# Patient Record
Sex: Male | Born: 1965 | Race: White | Hispanic: No | Marital: Married | Smoking: Current every day smoker
Health system: Southern US, Community
[De-identification: ages and names within clinical notes are randomized; demographics above are authoritative.]

## PROBLEM LIST (undated history)

## (undated) DIAGNOSIS — I10 Essential (primary) hypertension: Secondary | ICD-10-CM

## (undated) HISTORY — DX: Essential (primary) hypertension: I10

---

## 2000-02-13 ENCOUNTER — Other Ambulatory Visit: Admission: RE | Admit: 2000-02-13 | Discharge: 2000-02-13 | Payer: Self-pay | Admitting: Urology

## 2002-05-06 ENCOUNTER — Encounter: Admission: RE | Admit: 2002-05-06 | Discharge: 2002-06-10 | Payer: Self-pay | Admitting: Family Medicine

## 2016-10-04 ENCOUNTER — Ambulatory Visit (INDEPENDENT_AMBULATORY_CARE_PROVIDER_SITE_OTHER): Payer: Self-pay | Admitting: Physician Assistant

## 2016-10-04 VITALS — BP 156/92 | HR 78 | Temp 99.0°F | Resp 16 | Ht 70.0 in | Wt 217.0 lb

## 2016-10-04 DIAGNOSIS — I10 Essential (primary) hypertension: Secondary | ICD-10-CM | POA: Insufficient documentation

## 2016-10-04 DIAGNOSIS — Z13220 Encounter for screening for lipoid disorders: Secondary | ICD-10-CM

## 2016-10-04 DIAGNOSIS — R030 Elevated blood-pressure reading, without diagnosis of hypertension: Secondary | ICD-10-CM

## 2016-10-04 DIAGNOSIS — K219 Gastro-esophageal reflux disease without esophagitis: Secondary | ICD-10-CM

## 2016-10-04 MED ORDER — LISINOPRIL 10 MG PO TABS: 10.0000 mg | ORAL_TABLET | Freq: Every day | ORAL | 0 refills | Status: DC

## 2016-10-04 MED ORDER — OMEPRAZOLE 20 MG PO CPDR: 20.0000 mg | DELAYED_RELEASE_CAPSULE | Freq: Every day | ORAL | 0 refills | Status: DC

## 2016-10-04 NOTE — Progress Notes (Signed)
Jacob Chen  MRN: [id number] DOB: 05-Nov-1965  PCP: No primary care provider on file.  Subjective:  Pt is a 51 year old male presents to clinic for elevated blood pressure.  He is the youngest of 25 - all 38 of his brothers have HTN. This worries him and he would like to be worked up for high blood pressure.  C/o recent swelling in feet, sensitive bottoms of feet - he has to wear slippers in his house. Chest tightness - happens when he lays down and thinks about high blood pressure. Denies SOB, n/t, chest pain, SOB.  Home pressures have been around 160/100 - he has taken about 6 readings.   Exercises daily - rides bike and runs.  Diet - eats meat daily - mostly chicken. Eats fruits and veggies.   He is a smoker - 1/2 pack/day x 30 years.  Drinks alcohol - 8 drinks/week. Liquor He has cut back on drinking over the past month due to leg swelling. Was drinking 1/2 pint daily. No alcohol in 2 weeks. 1-2 beers/daily.   Abdominal pain - this is a chronic problem. 5/10 pain. Worse with laying down, sour taste in mouth, occasional burning in chest.    Review of Systems  Constitutional: Negative for diaphoresis and fatigue.  Respiratory: Negative for cough, chest tightness, shortness of breath and wheezing.   Cardiovascular: Positive for leg swelling. Negative for chest pain and palpitations.  Gastrointestinal: Positive for abdominal pain. Negative for diarrhea, nausea and vomiting.  Neurological: Negative for dizziness, syncope, light-headedness and headaches.    There are no active problems to display for this patient.   No current outpatient prescriptions on file prior to visit.   No current facility-administered medications on file prior to visit.     No Known Allergies   Objective:  BP (!) 142/80   Pulse 78   Temp 99 F (37.2 C) (Oral)   Resp 16   Ht 5' 10"  (1.778 m)   Wt 217 lb (98.4 kg)   SpO2 99%   BMI 31.14 kg/m   Physical Exam  Constitutional: He is oriented to  person, place, and time and well-developed, well-nourished, and in no distress. No distress.  Cardiovascular: Normal rate, regular rhythm, normal heart sounds, intact distal pulses and normal pulses.   Pulmonary/Chest: Effort normal. He has no wheezes. He has no rales.  Neurological: He is alert and oriented to person, place, and time. He has normal sensation. GCS score is 15.  Skin: Skin is warm and dry.  No edema b/l feet. Pulses in tact. No skin changes, ulcerations, temperature changes.   Psychiatric: Mood, memory, affect and judgment normal.  Vitals reviewed.   Assessment and Plan :  1. Elevated blood pressure reading 2. Essential hypertension - Recheck vitals - CMP14+EGFR - CBC with Differential/Platelet - lisinopril (PRINIVIL,ZESTRIL) 10 MG tablet; Take 1 tablet (10 mg total) by mouth daily.  Dispense: 45 tablet; Refill: 0 - Pt has strong FHx HTN. He exercises several times a week, room for improvement with diet.  DASH diet discussed and printed off for pt. Pt asked to record home blood pressures and bring them to next appt. Labs are pending, will contact with results. RTC in 4-6 weeks for f/u.   3. Screening, lipid - Lipid panel  4. Gastroesophageal reflux disease, esophagitis presence not specified - omeprazole (PRILOSEC) 20 MG capsule; Take 1 capsule (20 mg total) by mouth daily. Take 30 min before meal  Dispense: 60 capsule; Refill: 0 -  Abdominal pain likely due to GERD as per HPI. Will try trial of PPI, RTC in 4-6 weeks for recheck of symptoms. GERD prevention information discussed with pt.   Mercer Pod, PA-C  [facility] at [facility] 10/04/2016 9:33 AM

## 2016-10-04 NOTE — Patient Instructions (Addendum)
Come back and see me in 4-6 weeks for blood pressure check. Take a few blood pressures at home, write them down and bring them with you - do this at different times of the day.  See below for blood pressure lowering foods.   Omeprazole is for GERD. Take this 4-8 weeks, then stop therapy.   Thank you for coming in today. I hope you feel we met your needs.  Feel free to call [facility] if you have any questions or further requests.  Please consider signing up for MyChart if you do not already have it, as this is a great way to communicate with me.  Best,  Whitney McVey, PA-C  DASH Eating Plan DASH stands for "Dietary Approaches to Stop Hypertension." The DASH eating plan is a healthy eating plan that has been shown to reduce high blood pressure (hypertension). It may also reduce your risk for type 2 diabetes, heart disease, and stroke. The DASH eating plan may also help with weight loss. What are tips for following this plan? General guidelines   Avoid eating more than 2,300 mg (milligrams) of salt (sodium) a day. If you have hypertension, you may need to reduce your sodium intake to 1,500 mg a day.  Limit alcohol intake to no more than 1 drink a day for nonpregnant women and 2 drinks a day for men. One drink equals 12 oz of beer, 5 oz of wine, or 1 oz of hard liquor.  Work with your health care provider to maintain a healthy body weight or to lose weight. Ask what an ideal weight is for you.  Get at least 30 minutes of exercise that causes your heart to beat faster (aerobic exercise) most days of the week. Activities may include walking, swimming, or biking.  Work with your health care provider or diet and nutrition specialist (dietitian) to adjust your eating plan to your individual calorie needs. Reading food labels   Check food labels for the amount of sodium per serving. Choose foods with less than 5 percent of the Daily Value of sodium. Generally, foods with less than 300 mg of sodium per  serving fit into this eating plan.  To find whole grains, look for the word "whole" as the first word in the ingredient list. Shopping   Buy products labeled as "low-sodium" or "no salt added."  Buy fresh foods. Avoid canned foods and premade or frozen meals. Cooking   Avoid adding salt when cooking. Use salt-free seasonings or herbs instead of table salt or sea salt. Check with your health care provider or pharmacist before using salt substitutes.  Do not fry foods. Cook foods using healthy methods such as baking, boiling, grilling, and broiling instead.  Cook with heart-healthy oils, such as olive, canola, soybean, or sunflower oil. Meal planning    Eat a balanced diet that includes:  5 or more servings of fruits and vegetables each day. At each meal, try to fill half of your plate with fruits and vegetables.  Up to 6-8 servings of whole grains each day.  Less than 6 oz of lean meat, poultry, or fish each day. A 3-oz serving of meat is about the same size as a deck of cards. One egg equals 1 oz.  2 servings of low-fat dairy each day.  A serving of nuts, seeds, or beans 5 times each week.  Heart-healthy fats. Healthy fats called Omega-3 fatty acids are found in foods such as flaxseeds and coldwater fish, like sardines, salmon, and  mackerel.  Limit how much you eat of the following:  Canned or prepackaged foods.  Food that is high in trans fat, such as fried foods.  Food that is high in saturated fat, such as fatty meat.  Sweets, desserts, sugary drinks, and other foods with added sugar.  Full-fat dairy products.  Do not salt foods before eating.  Try to eat at least 2 vegetarian meals each week.  Eat more home-cooked food and less restaurant, buffet, and fast food.  When eating at a restaurant, ask that your food be prepared with less salt or no salt, if possible. What foods are recommended? The items listed may not be a complete list. Talk with your dietitian  about what dietary choices are best for you. Grains  Whole-grain or whole-wheat bread. Whole-grain or whole-wheat pasta. Brown rice. [place]row. Bulgur. Whole-grain and low-sodium cereals. Pita bread. Low-fat, low-sodium crackers. Whole-wheat flour tortillas. Vegetables  Fresh or frozen vegetables (raw, steamed, roasted, or grilled). Low-sodium or reduced-sodium tomato and vegetable juice. Low-sodium or reduced-sodium tomato sauce and tomato paste. Low-sodium or reduced-sodium canned vegetables. Fruits  All fresh, dried, or frozen fruit. Canned fruit in natural juice (without added sugar). Meat and other protein foods  Skinless chicken or [place]. Ground chicken or [place]. Pork with fat trimmed off. Fish and seafood. Egg whites. Dried beans, peas, or lentils. Unsalted nuts, nut butters, and seeds. Unsalted canned beans. Lean cuts of beef with fat trimmed off. Low-sodium, lean deli meat. Dairy  Low-fat (1%) or fat-free (skim) milk. Fat-free, low-fat, or reduced-fat cheeses. Nonfat, low-sodium ricotta or cottage cheese. Low-fat or nonfat yogurt. Low-fat, low-sodium cheese. Fats and oils  Soft margarine without trans fats. Vegetable oil. Low-fat, reduced-fat, or light mayonnaise and salad dressings (reduced-sodium). Canola, safflower, olive, soybean, and sunflower oils. Avocado. Seasoning and other foods  Herbs. Spices. Seasoning mixes without salt. Unsalted popcorn and pretzels. Fat-free sweets. What foods are not recommended? The items listed may not be a complete list. Talk with your dietitian about what dietary choices are best for you. Grains  Baked goods made with fat, such as croissants, muffins, or some breads. Dry pasta or rice meal packs. Vegetables  Creamed or fried vegetables. Vegetables in a cheese sauce. Regular canned vegetables (not low-sodium or reduced-sodium). Regular canned tomato sauce and paste (not low-sodium or reduced-sodium). Regular tomato and vegetable juice (not  low-sodium or reduced-sodium). Angie Fava. Olives. Fruits  Canned fruit in a light or heavy syrup. Fried fruit. Fruit in cream or butter sauce. Meat and other protein foods  Fatty cuts of meat. Ribs. Fried meat. Berniece Salines. Sausage. Bologna and other processed lunch meats. Salami. Fatback. Hotdogs. Bratwurst. Salted nuts and seeds. Canned beans with added salt. Canned or smoked fish. Whole eggs or egg yolks. Chicken or [place] with skin. Dairy  Whole or 2% milk, cream, and half-and-half. Whole or full-fat cream cheese. Whole-fat or sweetened yogurt. Full-fat cheese. Nondairy creamers. Whipped toppings. Processed cheese and cheese spreads. Fats and oils  Butter. Stick margarine. Lard. Shortening. Ghee. Bacon fat. Tropical oils, such as coconut, palm kernel, or palm oil. Seasoning and other foods  Salted popcorn and pretzels. Onion salt, garlic salt, seasoned salt, table salt, and sea salt. Worcestershire sauce. Tartar sauce. Barbecue sauce. Teriyaki sauce. Soy sauce, including reduced-sodium. Steak sauce. Canned and packaged gravies. Fish sauce. Oyster sauce. Cocktail sauce. Horseradish that you find on the shelf. Ketchup. Mustard. Meat flavorings and tenderizers. Bouillon cubes. Hot sauce and Tabasco sauce. Premade or packaged marinades. Premade or packaged taco  seasonings. Relishes. Regular salad dressings. Where to find more information:  National Heart, Lung, and [place]: https://wilson-eaton.com/  American Heart Association: www.heart.org Summary  The DASH eating plan is a healthy eating plan that has been shown to reduce high blood pressure (hypertension). It may also reduce your risk for type 2 diabetes, heart disease, and stroke.  With the DASH eating plan, you should limit salt (sodium) intake to 2,300 mg a day. If you have hypertension, you may need to reduce your sodium intake to 1,500 mg a day.  When on the DASH eating plan, aim to eat more fresh fruits and vegetables, whole grains, lean  proteins, low-fat dairy, and heart-healthy fats.  Work with your health care provider or diet and nutrition specialist (dietitian) to adjust your eating plan to your individual calorie needs. This information is not intended to replace advice given to you by your health care provider. Make sure you discuss any questions you have with your health care provider. Document Released: 06/22/2011 Document Revised: 06/26/2016 Document Reviewed: 06/26/2016 Elsevier Interactive Patient Education  2017 [employer].   IF you received an x-ray today, you will receive an invoice from [facility]. Please contact [facility] at [phone] with questions or concerns regarding your invoice.   IF you received labwork today, you will receive an invoice from [facility]. Please contact [facility] at [phone] with questions or concerns regarding your invoice.   Our billing staff will not be able to assist you with questions regarding bills from these companies.  You will be contacted with the lab results as soon as they are available. The fastest way to get your results is to activate your My Chart account. Instructions are located on the last page of this paperwork. If you have not heard from [place] regarding the results in 2 weeks, please contact this office.

## 2016-10-05 ENCOUNTER — Encounter: Payer: Self-pay | Admitting: Physician Assistant

## 2016-10-05 LAB — CBC WITH DIFFERENTIAL/PLATELET

## 2016-10-05 LAB — CMP14+EGFR
ALT: 79 IU/L — ABNORMAL HIGH (ref 0–44)
AST: 61 IU/L — ABNORMAL HIGH (ref 0–40)
Albumin/Globulin Ratio: 1.7 (ref 1.2–2.2)
Albumin: 4.5 g/dL (ref 3.5–5.5)
Alkaline Phosphatase: 85 IU/L (ref 39–117)
BUN/Creatinine Ratio: 14 (ref 9–20)
BUN: 11 mg/dL (ref 6–24)
Bilirubin Total: 0.5 mg/dL (ref 0.0–1.2)
CO2: 25 mmol/L (ref 18–29)
Calcium: 9.9 mg/dL (ref 8.7–10.2)
Chloride: 101 mmol/L (ref 96–106)
Creatinine, Ser: 0.76 mg/dL (ref 0.76–1.27)
GFR calc Af Amer: 122 mL/min/{1.73_m2} (ref >59)
GFR calc non Af Amer: 106 mL/min/{1.73_m2} (ref >59)
Globulin, Total: 2.7 g/dL (ref 1.5–4.5)
Glucose: 94 mg/dL (ref 65–99)
Potassium: 4.9 mmol/L (ref 3.5–5.2)
Sodium: 139 mmol/L (ref 134–144)
Total Protein: 7.2 g/dL (ref 6.0–8.5)

## 2016-10-05 LAB — LIPID PANEL
Chol/HDL Ratio: 3.3 ratio units (ref 0.0–5.0)
Cholesterol, Total: 187 mg/dL (ref 100–199)
HDL: 57 mg/dL (ref >39)
LDL Calculated: 107 mg/dL — ABNORMAL HIGH (ref 0–99)
Triglycerides: 117 mg/dL (ref 0–149)
VLDL Cholesterol Cal: 23 mg/dL (ref 5–40)

## 2016-10-10 ENCOUNTER — Encounter: Payer: Self-pay | Admitting: Physician Assistant

## 2016-10-16 NOTE — Progress Notes (Signed)
Elevated liver enzymes likely due to alcohol consumption. Advised pt to continue cutting back on the amount he drinks. Repeat labs in 6 months.

## 2016-11-16 ENCOUNTER — Telehealth: Payer: Self-pay | Admitting: Physician Assistant

## 2016-11-16 DIAGNOSIS — I10 Essential (primary) hypertension: Secondary | ICD-10-CM

## 2016-11-16 MED ORDER — LISINOPRIL 10 MG PO TABS: 10.0000 mg | ORAL_TABLET | Freq: Every day | ORAL | 0 refills | Status: DC

## 2016-11-16 NOTE — Telephone Encounter (Signed)
PATIENT WOULD LIKE Jacob Chen TO GIVE HIM A REFILL ON HIS LISINOPRIL 10 MG JUST ENOUGH TO LAST UNTIL HIS OFFICE VISIT WITH HER ON 11/27/16. HE ORIGINALLY HAD AN APPOINTMENT ON 11/20/16 BUT HE WILL BE OUT OF TOWN. BEST PHONE [phone] (CELL) PHARMACY CHOICE IS [facility]. [facility]BC

## 2016-11-20 ENCOUNTER — Ambulatory Visit: Payer: Self-pay | Admitting: Physician Assistant

## 2016-11-27 ENCOUNTER — Other Ambulatory Visit: Payer: Self-pay | Admitting: Physician Assistant

## 2016-11-27 ENCOUNTER — Ambulatory Visit (INDEPENDENT_AMBULATORY_CARE_PROVIDER_SITE_OTHER): Payer: Self-pay | Admitting: Physician Assistant

## 2016-11-27 ENCOUNTER — Encounter: Payer: Self-pay | Admitting: Physician Assistant

## 2016-11-27 VITALS — BP 149/92 | HR 77 | Resp 16 | Ht 69.69 in | Wt 212.0 lb

## 2016-11-27 DIAGNOSIS — K219 Gastro-esophageal reflux disease without esophagitis: Secondary | ICD-10-CM

## 2016-11-27 DIAGNOSIS — Z79899 Other long term (current) drug therapy: Secondary | ICD-10-CM

## 2016-11-27 DIAGNOSIS — I10 Essential (primary) hypertension: Secondary | ICD-10-CM

## 2016-11-27 MED ORDER — LISINOPRIL 20 MG PO TABS: 20.0000 mg | ORAL_TABLET | Freq: Every day | ORAL | 0 refills | Status: DC

## 2016-11-27 NOTE — Patient Instructions (Signed)
     IF you received an x-ray today, you will receive an invoice from [facility]. Please contact  Radiology at [phone] with questions or concerns regarding your invoice.   IF you received labwork today, you will receive an invoice from [facility]. Please contact [facility] at [phone] with questions or concerns regarding your invoice.   Our billing staff will not be able to assist you with questions regarding bills from these companies.  You will be contacted with the lab results as soon as they are available. The fastest way to get your results is to activate your My Chart account. Instructions are located on the last page of this paperwork. If you have not heard from us regarding the results in 2 weeks, please contact this office.     

## 2016-11-27 NOTE — Progress Notes (Signed)
   Jacob Chen  MRN: [id number] DOB: 09/08/1965  PCP: Jacob Chen, Jacob Lafuente Whitney, PA-C  Subjective:  Pt is a 51 year old male who presents to clinic for f/u HTN.  3/21 OV started Lisinopril 10 mg qd. Denies cough, swelling of the face, chest pain, neck pain, arm pain, headache, dizziness, lightheadedness, pre-syncope.  Today's blood pressure is 149/92. He recently took blood pressures at [facility] 154/95 and 148/96.  He has been cutting back on his carbohydrates.   Exercises daily - rides bike and runs.  Diet - eats meat daily - mostly chicken. Eats fruits and veggies  He is a smoker - 1/2 pack/day x 30 years.  Drinks alcohol - 8 drinks/week. Liquor He has cut back on drinking over the past month due to leg swelling. Was drinking 1/2 pint daily. No alcohol in 2 weeks. 1-2 beers/daily.  Review of Systems  Respiratory: Negative for cough, chest tightness, shortness of breath and wheezing.   Cardiovascular: Negative for chest pain, palpitations and leg swelling.  Neurological: Negative for dizziness, syncope, light-headedness and headaches.    Patient Active Problem List   Diagnosis Date Noted  . Essential hypertension 10/04/2016    Current Outpatient Prescriptions on File Prior to Visit  Medication Sig Dispense Refill  . lisinopril (PRINIVIL,ZESTRIL) 10 MG tablet Take 1 tablet (10 mg total) by mouth daily. 14 tablet 0  . omeprazole (PRILOSEC) 20 MG capsule Take 1 capsule (20 mg total) by mouth daily. Take 30 min before meal 60 capsule 0   No current facility-administered medications on file prior to visit.     No Known Allergies   Objective:  BP (!) 149/92 (BP Location: Right Arm, Patient Position: Sitting, Cuff Size: Normal)   Pulse 77   Resp 16   Ht 5' 9.69" (1.77 m)   Wt 212 lb (96.2 kg)   SpO2 100%   BMI 30.69 kg/m   Physical Exam  Constitutional: He is oriented to person, place, and time and well-developed, well-nourished, and in no distress. No distress.    Cardiovascular: Normal rate, regular rhythm and normal heart sounds.   Neurological: He is alert and oriented to person, place, and time. GCS score is 15.  Skin: Skin is warm and dry.  Psychiatric: Mood, memory, affect and judgment normal.  Vitals reviewed.   Assessment and Plan :  1. Essential hypertension 2. Encounter for medication management - lisinopril (PRINIVIL,ZESTRIL) 20 MG tablet; Take 1 tablet (20 mg total) by mouth daily.  Dispense: 90 tablet; Refill: 0 - This is Mr. Jacob Chen's first OV since starting him on Lisinopril. He is feeling well, denies side effects. Blood pressure remains elevated. He took 2 readings at home, also elevated. Will increased daily dose to 20mg . RTC in 1-2 months for recheck. Encouraged con't DASH diet and exercise.   Jacob CollieWhitney Madysen Faircloth, PA-C  [facility] 11/27/2016 8:13 AM

## 2016-11-30 MED ORDER — OMEPRAZOLE 20 MG PO CPDR: 20.0000 mg | DELAYED_RELEASE_CAPSULE | Freq: Every day | ORAL | 0 refills | Status: DC

## 2016-12-18 ENCOUNTER — Ambulatory Visit (INDEPENDENT_AMBULATORY_CARE_PROVIDER_SITE_OTHER): Payer: Self-pay | Admitting: Physician Assistant

## 2016-12-18 ENCOUNTER — Encounter: Payer: Self-pay | Admitting: Physician Assistant

## 2016-12-18 VITALS — BP 140/94 | HR 90 | Temp 98.6°F | Resp 16 | Ht 70.0 in | Wt 213.6 lb

## 2016-12-18 DIAGNOSIS — F17210 Nicotine dependence, cigarettes, uncomplicated: Secondary | ICD-10-CM

## 2016-12-18 DIAGNOSIS — I10 Essential (primary) hypertension: Secondary | ICD-10-CM

## 2016-12-18 DIAGNOSIS — R030 Elevated blood-pressure reading, without diagnosis of hypertension: Secondary | ICD-10-CM

## 2016-12-18 MED ORDER — LISINOPRIL 20 MG PO TABS: 20.0000 mg | ORAL_TABLET | Freq: Every day | ORAL | 0 refills | Status: DC

## 2016-12-18 NOTE — Progress Notes (Signed)
   Jacob Chen  MRN: [id number] DOB: 09/28/1965  PCP: Sebastian AcheMcVey, Berthel Bagnall Whitney, PA-C  Subjective:  Pt is a pleasant 51 year old male who presents to clinic for f/u blood pressure. Owns fitness Center, [employer]. He is an Biomedical engineerAir BNB host.   He was diagnosed with HTN 3 months ago. Since that time, he was started on Lisinopril and has cut back on his drinking. Denies medication side effects. Denies chest pain, palpitations, lightheadedness, headaches, vision changes, cough.   Last OV 5/14 blood pressure 149/92, with home blood pressures of 154/95 and 148/92. Increased lisinopril 20mg  qd from 10mg  qd.  Today his blood pressure is 150/97, recheck is 140/94. He reports home blood pressure Chen of 129/82.  Recent life stressor: brother diagnosed with Non-Hodgkin's Lymphoma last month.  Exercises daily - rides bike and runs.  Diet - eats meat daily - mostly chicken. Eats fruits and veggies  He is still smokin 1/2 pack/day x 30 years.  Drinks alcohol - 8 drinks/week. Liquor He has cut back on drinking over the past month due to leg swelling. Was drinking 1/2 pint daily. No alcohol in 2 weeks. 1-2 beers/daily.   Of note, he is about to start a CPAP soon.   Review of Systems  Constitutional: Negative for chills and diaphoresis.  Respiratory: Negative for cough, chest tightness, shortness of breath and wheezing.   Cardiovascular: Negative for chest pain, palpitations and leg swelling.  Gastrointestinal: Negative for diarrhea, nausea and vomiting.  Neurological: Negative for dizziness, syncope, light-headedness and headaches.    Patient Active Problem List   Diagnosis Date Noted  . Essential hypertension 10/04/2016    Current Outpatient Prescriptions on File Prior to Visit  Medication Sig Dispense Refill  . lisinopril (PRINIVIL,ZESTRIL) 20 MG tablet Take 1 tablet (20 mg total) by mouth daily. 90 tablet 0  . omeprazole (PRILOSEC) 20 MG capsule Take 1 capsule (20 mg total) by mouth daily.  Take 30 min before meal 60 capsule 0   No current facility-administered medications on file prior to visit.     No Known Allergies   Objective:  BP (!) 150/97   Pulse 90   Temp 98.6 F (37 C) (Oral)   Resp 16   Ht 5\' 10"  (1.778 m)   Wt 213 lb 9.6 oz (96.9 kg)   SpO2 97%   BMI 30.65 kg/m   Physical Exam  Constitutional: He is oriented to person, place, and time and well-developed, well-nourished, and in no distress. No distress.  Cardiovascular: Normal rate, regular rhythm and normal heart sounds.   Neurological: He is alert and oriented to person, place, and time. GCS score is 15.  Skin: Skin is warm and dry.  Psychiatric: Mood, memory, affect and judgment normal.  Vitals reviewed.   Assessment and Plan :  1. Essential hypertension 2. Elevated blood pressure Chen 3. Cigarette smoker - lisinopril (PRINIVIL,ZESTRIL) 20 MG tablet; Take 1 tablet (20 mg total) by mouth daily.  Dispense: 90 tablet; Refill: 0 - Recheck vitals - Blood pressure is elevated in office today. Suspect possible increased life stressor and recent illness. He reports blood pressure out of office wnl. Will con't Lisinopril 20mg  qd at this time. RTC in 2-3 months for annual exam and blood pressure recheck. Encouraged con't DASH diet and exercise.   Marco CollieWhitney Ever Gustafson, PA-C  [facility] 12/18/2016 8:10 AM

## 2016-12-18 NOTE — Patient Instructions (Addendum)
Please come back in 2-3 months for your annual physical. I would like to recheck your liver enzymes to make sure they have returned to normal. We will also recheck your blood pressure.  Take blood pressures at home a few time.  Try to cut back on your smoking.   Thank you for coming in today. I hope you feel we met your needs.  Feel free to call [facility] if you have any questions or further requests.  Please consider signing up for MyChart if you do not already have it, as this is a great way to communicate with me.  Best,  Whitney McVey, PA-C  DASH Eating Plan DASH stands for "Dietary Approaches to Stop Hypertension." The DASH eating plan is a healthy eating plan that has been shown to reduce high blood pressure (hypertension). It may also reduce your risk for type 2 diabetes, heart disease, and stroke. The DASH eating plan may also help with weight loss. What are tips for following this plan? General guidelines  Avoid eating more than 2,300 mg (milligrams) of salt (sodium) a day. If you have hypertension, you may need to reduce your sodium intake to 1,500 mg a day.  Limit alcohol intake to no more than 1 drink a day for nonpregnant women and 2 drinks a day for men. One drink equals 12 oz of beer, 5 oz of wine, or 1 oz of hard liquor.  Work with your health care provider to maintain a healthy body weight or to lose weight. Ask what an ideal weight is for you.  Get at least 30 minutes of exercise that causes your heart to beat faster (aerobic exercise) most days of the week. Activities may include walking, swimming, or biking.  Work with your health care provider or diet and nutrition specialist (dietitian) to adjust your eating plan to your individual calorie needs. Reading food labels  Check food labels for the amount of sodium per serving. Choose foods with less than 5 percent of the Daily Value of sodium. Generally, foods with less than 300 mg of sodium per serving fit into this eating  plan.  To find whole grains, look for the word "whole" as the first word in the ingredient list. Shopping  Buy products labeled as "low-sodium" or "no salt added."  Buy fresh foods. Avoid canned foods and premade or frozen meals. Cooking  Avoid adding salt when cooking. Use salt-free seasonings or herbs instead of table salt or sea salt. Check with your health care provider or pharmacist before using salt substitutes.  Do not fry foods. Cook foods using healthy methods such as baking, boiling, grilling, and broiling instead.  Cook with heart-healthy oils, such as olive, canola, soybean, or sunflower oil. Meal planning   Eat a balanced diet that includes: ? 5 or more servings of fruits and vegetables each day. At each meal, try to fill half of your plate with fruits and vegetables. ? Up to 6-8 servings of whole grains each day. ? Less than 6 oz of lean meat, poultry, or fish each day. A 3-oz serving of meat is about the same size as a deck of cards. One egg equals 1 oz. ? 2 servings of low-fat dairy each day. ? A serving of nuts, seeds, or beans 5 times each week. ? Heart-healthy fats. Healthy fats called Omega-3 fatty acids are found in foods such as flaxseeds and coldwater fish, like sardines, salmon, and mackerel.  Limit how much you eat of the following: ? Canned  or prepackaged foods. ? Food that is high in trans fat, such as fried foods. ? Food that is high in saturated fat, such as fatty meat. ? Sweets, desserts, sugary drinks, and other foods with added sugar. ? Full-fat dairy products.  Do not salt foods before eating.  Try to eat at least 2 vegetarian meals each week.  Eat more home-cooked food and less restaurant, buffet, and fast food.  When eating at a restaurant, ask that your food be prepared with less salt or no salt, if possible. What foods are recommended? The items listed may not be a complete list. Talk with your dietitian about what dietary choices are best  for you. Grains Whole-grain or whole-wheat bread. Whole-grain or whole-wheat pasta. Brown rice. [place] Morrow. Bulgur. Whole-grain and low-sodium cereals. Pita bread. Low-fat, low-sodium crackers. Whole-wheat flour tortillas. Vegetables Fresh or frozen vegetables (raw, steamed, roasted, or grilled). Low-sodium or reduced-sodium tomato and vegetable juice. Low-sodium or reduced-sodium tomato sauce and tomato paste. Low-sodium or reduced-sodium canned vegetables. Fruits All fresh, dried, or frozen fruit. Canned fruit in natural juice (without added sugar). Meat and other protein foods Skinless chicken or [place]. Ground chicken or [place]. Pork with fat trimmed off. Fish and seafood. Egg whites. Dried beans, peas, or lentils. Unsalted nuts, nut butters, and seeds. Unsalted canned beans. Lean cuts of beef with fat trimmed off. Low-sodium, lean deli meat. Dairy Low-fat (1%) or fat-free (skim) milk. Fat-free, low-fat, or reduced-fat cheeses. Nonfat, low-sodium ricotta or cottage cheese. Low-fat or nonfat yogurt. Low-fat, low-sodium cheese. Fats and oils Soft margarine without trans fats. Vegetable oil. Low-fat, reduced-fat, or light mayonnaise and salad dressings (reduced-sodium). Canola, safflower, olive, soybean, and sunflower oils. Avocado. Seasoning and other foods Herbs. Spices. Seasoning mixes without salt. Unsalted popcorn and pretzels. Fat-free sweets. What foods are not recommended? The items listed may not be a complete list. Talk with your dietitian about what dietary choices are best for you. Grains Baked goods made with fat, such as croissants, muffins, or some breads. Dry pasta or rice meal packs. Vegetables Creamed or fried vegetables. Vegetables in a cheese sauce. Regular canned vegetables (not low-sodium or reduced-sodium). Regular canned tomato sauce and paste (not low-sodium or reduced-sodium). Regular tomato and vegetable juice (not low-sodium or reduced-sodium). Angie Fava.  Olives. Fruits Canned fruit in a light or heavy syrup. Fried fruit. Fruit in cream or butter sauce. Meat and other protein foods Fatty cuts of meat. Ribs. Fried meat. Berniece Salines. Sausage. Bologna and other processed lunch meats. Salami. Fatback. Hotdogs. Bratwurst. Salted nuts and seeds. Canned beans with added salt. Canned or smoked fish. Whole eggs or egg yolks. Chicken or [place] with skin. Dairy Whole or 2% milk, cream, and half-and-half. Whole or full-fat cream cheese. Whole-fat or sweetened yogurt. Full-fat cheese. Nondairy creamers. Whipped toppings. Processed cheese and cheese spreads. Fats and oils Butter. Stick margarine. Lard. Shortening. Ghee. Bacon fat. Tropical oils, such as coconut, palm kernel, or palm oil. Seasoning and other foods Salted popcorn and pretzels. Onion salt, garlic salt, seasoned salt, table salt, and sea salt. Worcestershire sauce. Tartar sauce. Barbecue sauce. Teriyaki sauce. Soy sauce, including reduced-sodium. Steak sauce. Canned and packaged gravies. Fish sauce. Oyster sauce. Cocktail sauce. Horseradish that you find on the shelf. Ketchup. Mustard. Meat flavorings and tenderizers. Bouillon cubes. Hot sauce and Tabasco sauce. Premade or packaged marinades. Premade or packaged taco seasonings. Relishes. Regular salad dressings. Where to find more information:  National Heart, Lung, and [place]: https://wilson-eaton.com/  American Heart Association: www.heart.org Summary  The  DASH eating plan is a healthy eating plan that has been shown to reduce high blood pressure (hypertension). It may also reduce your risk for type 2 diabetes, heart disease, and stroke.  With the DASH eating plan, you should limit salt (sodium) intake to 2,300 mg a day. If you have hypertension, you may need to reduce your sodium intake to 1,500 mg a day.  When on the DASH eating plan, aim to eat more fresh fruits and vegetables, whole grains, lean proteins, low-fat dairy, and heart-healthy  fats.  Work with your health care provider or diet and nutrition specialist (dietitian) to adjust your eating plan to your individual calorie needs. This information is not intended to replace advice given to you by your health care provider. Make sure you discuss any questions you have with your health care provider. Document Released: 06/22/2011 Document Revised: 06/26/2016 Document Reviewed: 06/26/2016 Elsevier Interactive Patient Education  2017 [employer].  IF you received an x-ray today, you will receive an invoice from [facility]. Please contact [facility] at [phone] with questions or concerns regarding your invoice.   IF you received labwork today, you will receive an invoice from [facility]. Please contact [facility] at [phone] with questions or concerns regarding your invoice.   Our billing staff will not be able to assist you with questions regarding bills from these companies.  You will be contacted with the lab results as soon as they are available. The fastest way to get your results is to activate your My Chart account. Instructions are located on the last page of this paperwork. If you have not heard from [place] regarding the results in 2 weeks, please contact this office.

## 2017-01-08 ENCOUNTER — Ambulatory Visit: Payer: Self-pay | Admitting: Physician Assistant

## 2017-02-02 ENCOUNTER — Telehealth: Payer: Self-pay | Admitting: Physician Assistant

## 2017-02-02 DIAGNOSIS — K219 Gastro-esophageal reflux disease without esophagitis: Secondary | ICD-10-CM

## 2017-02-02 MED ORDER — OMEPRAZOLE 20 MG PO CPDR: 20.0000 mg | DELAYED_RELEASE_CAPSULE | Freq: Every day | ORAL | 0 refills | Status: DC

## 2017-02-02 NOTE — Telephone Encounter (Signed)
PATIENT IS REQUESTING [facility] TO REFILL HIS  OMEPRAZOLE (PRILOSEC) 20 MG. HE SAID HE IS COMPLETELY OUT. (I DID EXPLAIN OUR REFILL POLICY). BEST PHONE [phone] (CELL) PHARMACY CHOICE IS [facility]. MBC

## 2017-02-06 NOTE — Telephone Encounter (Signed)
Thank you! Pt needs OV, as he needs repeat liver enzymes.

## 2017-02-08 ENCOUNTER — Other Ambulatory Visit: Payer: Self-pay

## 2017-02-08 DIAGNOSIS — K219 Gastro-esophageal reflux disease without esophagitis: Secondary | ICD-10-CM

## 2017-02-08 MED ORDER — OMEPRAZOLE 20 MG PO CPDR
20.0000 mg | DELAYED_RELEASE_CAPSULE | Freq: Every day | ORAL | 0 refills | Status: DC
Start: 2017-02-08 — End: 2017-04-04

## 2017-02-08 NOTE — Telephone Encounter (Signed)
30 days given. Pt has apt 03/26/17 can receive more rx refills at that apt.

## 2017-02-26 ENCOUNTER — Other Ambulatory Visit: Payer: Self-pay | Admitting: Physician Assistant

## 2017-02-26 DIAGNOSIS — I10 Essential (primary) hypertension: Secondary | ICD-10-CM

## 2017-03-01 MED ORDER — LISINOPRIL 20 MG PO TABS: 20.0000 mg | ORAL_TABLET | Freq: Every day | ORAL | 0 refills | Status: DC

## 2017-03-26 ENCOUNTER — Ambulatory Visit: Payer: Self-pay | Admitting: Physician Assistant

## 2017-04-04 ENCOUNTER — Encounter: Payer: Self-pay | Admitting: Physician Assistant

## 2017-04-04 ENCOUNTER — Ambulatory Visit (INDEPENDENT_AMBULATORY_CARE_PROVIDER_SITE_OTHER): Payer: Self-pay | Admitting: Physician Assistant

## 2017-04-04 VITALS — BP 124/84 | HR 78 | Temp 98.3°F | Resp 16 | Ht 69.5 in | Wt 211.6 lb

## 2017-04-04 DIAGNOSIS — I10 Essential (primary) hypertension: Secondary | ICD-10-CM

## 2017-04-04 DIAGNOSIS — K219 Gastro-esophageal reflux disease without esophagitis: Secondary | ICD-10-CM

## 2017-04-04 MED ORDER — OMEPRAZOLE 20 MG PO CPDR: 20.0000 mg | DELAYED_RELEASE_CAPSULE | Freq: Every day | ORAL | 1 refills | Status: DC

## 2017-04-04 NOTE — Progress Notes (Signed)
Jacob Chen  MRN: [id number] DOB: 1966/07/15  PCP: Dorise Hiss, PA-C  Subjective:  Pt is a pleasant 51 year old male who presents to clinic for HTN f/u.  He has 2 daughters. Oldest 10 yo - school at [employer]. Youngest is 37, wants to be an NP.  Owns fitness Center, [employer]. He is an Development worker, international aid host.   He was diagnosed with HTN 6 months ago. Since that time, he was started on Lisinopril and has cut back on his drinking. Denies medication side effects. Denies chest pain, leg swelling, palpitations, lightheadedness, headaches, vision changes, cough.   He is taking lisinopril 70m qd. Does not miss doses. Last OV 3 months ago - blood pressure was 140/94.   Today his blood pressure is 124/84. He reports home blood pressure reading of 129/82.  Exercises daily - rides bike and runs.  Diet - eats meat daily - mostly chicken. Eats fruits and veggies. Recently his diet has not been great, as he has been out of his home, due to AWm. [employer]   He is still smoking 1/2 pack/day x 30 years. He is trying to cut back, kind of.  Drinks alcohol - 8 drinks/week. Mixed drinks.  Was drinking 1/2 pint daily.   He recently got a CPAP. Has not tried it yet.   Review of Systems  Constitutional: Negative for chills and diaphoresis.  Respiratory: Negative for cough, chest tightness, shortness of breath and wheezing.   Cardiovascular: Negative for chest pain, palpitations and leg swelling.  Gastrointestinal: Negative for diarrhea, nausea and vomiting.  Musculoskeletal: Negative for neck pain.  Neurological: Negative for dizziness, syncope, light-headedness and headaches.  Psychiatric/Behavioral: Negative for sleep disturbance. The patient is not nervous/anxious.     Patient Active Problem List   Diagnosis Date Noted  . Essential hypertension 10/04/2016    Current Outpatient Prescriptions on File Prior to Visit  Medication Sig Dispense Refill  . lisinopril  (PRINIVIL,ZESTRIL) 20 MG tablet Take 1 tablet (20 mg total) by mouth daily. 90 tablet 0  . omeprazole (PRILOSEC) 20 MG capsule Take 1 capsule (20 mg total) by mouth daily. Take 30 min before meal (Patient not taking: Reported on 04/04/2017) 30 capsule 0   No current facility-administered medications on file prior to visit.     No Known Allergies   Objective:  BP 124/84   Pulse 78   Temp 98.3 F (36.8 C) (Oral)   Resp 16   Ht 5' 9.5" (1.765 m)   Wt 211 lb 9.6 oz (96 kg)   SpO2 98%   BMI 30.80 kg/m   Physical Exam  Constitutional: He is oriented to person, place, and time and well-developed, well-nourished, and in no distress. No distress.  Cardiovascular: Normal rate, regular rhythm and normal heart sounds.   Neurological: He is alert and oriented to person, place, and time. GCS score is 15.  Skin: Skin is warm and dry.  Psychiatric: Mood, memory, affect and judgment normal.  Vitals reviewed.  Lab Results  Component Value Date   ALT 79 (H) 10/04/2016   AST 61 (H) 10/04/2016   ALKPHOS 85 10/04/2016   BILITOT 0.5 10/04/2016    Assessment and Plan :  1. Essential hypertension - CMP14+EGFR - Lipid panel - Blood pressure is now controlled. Con't Lisinopril [id number]. He does not need refills now. OK to refill when needed to make it through to his next appt in 6 months. Elevated LFT's at his OV 3 months  ago. Labs are pending. Will contact with results. Encouraged cutting back on smoking and drinking. Start CPAP.  RTC in 6 months for recheck.  2. Gastroesophageal reflux disease, esophagitis presence not specified - omeprazole (PRILOSEC) 20 MG capsule; Take 1 capsule (20 mg total) by mouth daily. Take 30 min before meal  Dispense: 90 capsule; Refill: 1  Whitney Bryella Diviney, PA-C  [facility] [place] 04/04/2017 8:14 AM

## 2017-04-04 NOTE — Patient Instructions (Addendum)
Come back and see me in 6 months.  Thank you for coming in today. I hope you feel we met your needs.  Feel free to call PCP if you have any questions or further requests.  Please consider signing up for MyChart if you do not already have it, as this is a great way to communicate with me.  Best,  Whitney McVey, PA-C    DASH Eating Plan DASH stands for "Dietary Approaches to Stop Hypertension." The DASH eating plan is a healthy eating plan that has been shown to reduce high blood pressure (hypertension). It may also reduce your risk for type 2 diabetes, heart disease, and stroke. The DASH eating plan may also help with weight loss. What are tips for following this plan? General guidelines  Avoid eating more than 2,300 mg (milligrams) of salt (sodium) a day. If you have hypertension, you may need to reduce your sodium intake to 1,500 mg a day.  Limit alcohol intake to no more than 1 drink a day for nonpregnant women and 2 drinks a day for men. One drink equals 12 oz of beer, 5 oz of wine, or 1 oz of hard liquor.  Work with your health care provider to maintain a healthy body weight or to lose weight. Ask what an ideal weight is for you.  Get at least 30 minutes of exercise that causes your heart to beat faster (aerobic exercise) most days of the week. Activities may include walking, swimming, or biking.  Work with your health care provider or diet and nutrition specialist (dietitian) to adjust your eating plan to your individual calorie needs. Reading food labels  Check food labels for the amount of sodium per serving. Choose foods with less than 5 percent of the Daily Value of sodium. Generally, foods with less than 300 mg of sodium per serving fit into this eating plan.  To find whole grains, look for the word "whole" as the first word in the ingredient list. Shopping  Buy products labeled as "low-sodium" or "no salt added."  Buy fresh foods. Avoid canned foods and premade or frozen  meals. Cooking  Avoid adding salt when cooking. Use salt-free seasonings or herbs instead of table salt or sea salt. Check with your health care provider or pharmacist before using salt substitutes.  Do not fry foods. Cook foods using healthy methods such as baking, boiling, grilling, and broiling instead.  Cook with heart-healthy oils, such as olive, canola, soybean, or sunflower oil. Meal planning   Eat a balanced diet that includes: ? 5 or more servings of fruits and vegetables each day. At each meal, try to fill half of your plate with fruits and vegetables. ? Up to 6-8 servings of whole grains each day. ? Less than 6 oz of lean meat, poultry, or fish each day. A 3-oz serving of meat is about the same size as a deck of cards. One egg equals 1 oz. ? 2 servings of low-fat dairy each day. ? A serving of nuts, seeds, or beans 5 times each week. ? Heart-healthy fats. Healthy fats called Omega-3 fatty acids are found in foods such as flaxseeds and coldwater fish, like sardines, salmon, and mackerel.  Limit how much you eat of the following: ? Canned or prepackaged foods. ? Food that is high in trans fat, such as fried foods. ? Food that is high in saturated fat, such as fatty meat. ? Sweets, desserts, sugary drinks, and other foods with added sugar. ? Full-fat dairy  products.  Do not salt foods before eating.  Try to eat at least 2 vegetarian meals each week.  Eat more home-cooked food and less restaurant, buffet, and fast food.  When eating at a restaurant, ask that your food be prepared with less salt or no salt, if possible. What foods are recommended? The items listed may not be a complete list. Talk with your dietitian about what dietary choices are best for you. Grains Whole-grain or whole-wheat bread. Whole-grain or whole-wheat pasta. Brown rice. [place]row. Bulgur. Whole-grain and low-sodium cereals. Pita bread. Low-fat, low-sodium crackers. Whole-wheat flour  tortillas. Vegetables Fresh or frozen vegetables (raw, steamed, roasted, or grilled). Low-sodium or reduced-sodium tomato and vegetable juice. Low-sodium or reduced-sodium tomato sauce and tomato paste. Low-sodium or reduced-sodium canned vegetables. Fruits All fresh, dried, or frozen fruit. Canned fruit in natural juice (without added sugar). Meat and other protein foods Skinless chicken or [place]. Ground chicken or [place]. Pork with fat trimmed off. Fish and seafood. Egg whites. Dried beans, peas, or lentils. Unsalted nuts, nut butters, and seeds. Unsalted canned beans. Lean cuts of beef with fat trimmed off. Low-sodium, lean deli meat. Dairy Low-fat (1%) or fat-free (skim) milk. Fat-free, low-fat, or reduced-fat cheeses. Nonfat, low-sodium ricotta or cottage cheese. Low-fat or nonfat yogurt. Low-fat, low-sodium cheese. Fats and oils Soft margarine without trans fats. Vegetable oil. Low-fat, reduced-fat, or light mayonnaise and salad dressings (reduced-sodium). Canola, safflower, olive, soybean, and sunflower oils. Avocado. Seasoning and other foods Herbs. Spices. Seasoning mixes without salt. Unsalted popcorn and pretzels. Fat-free sweets. What foods are not recommended? The items listed may not be a complete list. Talk with your dietitian about what dietary choices are best for you. Grains Baked goods made with fat, such as croissants, muffins, or some breads. Dry pasta or rice meal packs. Vegetables Creamed or fried vegetables. Vegetables in a cheese sauce. Regular canned vegetables (not low-sodium or reduced-sodium). Regular canned tomato sauce and paste (not low-sodium or reduced-sodium). Regular tomato and vegetable juice (not low-sodium or reduced-sodium). Angie Fava. Olives. Fruits Canned fruit in a light or heavy syrup. Fried fruit. Fruit in cream or butter sauce. Meat and other protein foods Fatty cuts of meat. Ribs. Fried meat. Berniece Salines. Sausage. Bologna and other processed lunch meats.  Salami. Fatback. Hotdogs. Bratwurst. Salted nuts and seeds. Canned beans with added salt. Canned or smoked fish. Whole eggs or egg yolks. Chicken or [place] with skin. Dairy Whole or 2% milk, cream, and half-and-half. Whole or full-fat cream cheese. Whole-fat or sweetened yogurt. Full-fat cheese. Nondairy creamers. Whipped toppings. Processed cheese and cheese spreads. Fats and oils Butter. Stick margarine. Lard. Shortening. Ghee. Bacon fat. Tropical oils, such as coconut, palm kernel, or palm oil. Seasoning and other foods Salted popcorn and pretzels. Onion salt, garlic salt, seasoned salt, table salt, and sea salt. Worcestershire sauce. Tartar sauce. Barbecue sauce. Teriyaki sauce. Soy sauce, including reduced-sodium. Steak sauce. Canned and packaged gravies. Fish sauce. Oyster sauce. Cocktail sauce. Horseradish that you find on the shelf. Ketchup. Mustard. Meat flavorings and tenderizers. Bouillon cubes. Hot sauce and Tabasco sauce. Premade or packaged marinades. Premade or packaged taco seasonings. Relishes. Regular salad dressings. Where to find more information:  National Heart, Lung, and [place]: https://wilson-eaton.com/  American Heart Association: www.heart.org Summary  The DASH eating plan is a healthy eating plan that has been shown to reduce high blood pressure (hypertension). It may also reduce your risk for type 2 diabetes, heart disease, and stroke.  With the DASH eating plan, you should limit  salt (sodium) intake to 2,300 mg a day. If you have hypertension, you may need to reduce your sodium intake to 1,500 mg a day.  When on the DASH eating plan, aim to eat more fresh fruits and vegetables, whole grains, lean proteins, low-fat dairy, and heart-healthy fats.  Work with your health care provider or diet and nutrition specialist (dietitian) to adjust your eating plan to your individual calorie needs. This information is not intended to replace advice given to you by your health  care provider. Make sure you discuss any questions you have with your health care provider. Document Released: 06/22/2011 Document Revised: 06/26/2016 Document Reviewed: 06/26/2016 Elsevier Interactive Patient Education  2017 [employer].   IF you received an x-ray today, you will receive an invoice from [facility]. Please contact [facility] at [phone] with questions or concerns regarding your invoice.   IF you received labwork today, you will receive an invoice from [facility]. Please contact [facility] at [phone] with questions or concerns regarding your invoice.   Our billing staff will not be able to assist you with questions regarding bills from these companies.  You will be contacted with the lab results as soon as they are available. The fastest way to get your results is to activate your My Chart account. Instructions are located on the last page of this paperwork. If you have not heard from [place] regarding the results in 2 weeks, please contact this office.

## 2017-04-05 LAB — CMP14+EGFR
ALT: 93 IU/L — ABNORMAL HIGH (ref 0–44)
AST: 59 IU/L — ABNORMAL HIGH (ref 0–40)
Albumin/Globulin Ratio: 1.7 (ref 1.2–2.2)
Albumin: 4.4 g/dL (ref 3.5–5.5)
Alkaline Phosphatase: 67 IU/L (ref 39–117)
BUN/Creatinine Ratio: 16 (ref 9–20)
BUN: 16 mg/dL (ref 6–24)
Bilirubin Total: 0.7 mg/dL (ref 0.0–1.2)
CO2: 21 mmol/L (ref 20–29)
Calcium: 9.6 mg/dL (ref 8.7–10.2)
Chloride: 101 mmol/L (ref 96–106)
Creatinine, Ser: 1 mg/dL (ref 0.76–1.27)
GFR calc Af Amer: 100 mL/min/{1.73_m2} (ref >59)
GFR calc non Af Amer: 86 mL/min/{1.73_m2} (ref >59)
Globulin, Total: 2.6 g/dL (ref 1.5–4.5)
Glucose: 95 mg/dL (ref 65–99)
Potassium: 4.6 mmol/L (ref 3.5–5.2)
Sodium: 139 mmol/L (ref 134–144)
Total Protein: 7 g/dL (ref 6.0–8.5)

## 2017-04-05 LAB — LIPID PANEL
Chol/HDL Ratio: 3.9 ratio (ref 0.0–5.0)
Cholesterol, Total: 204 mg/dL — ABNORMAL HIGH (ref 100–199)
HDL: 52 mg/dL (ref >39)
LDL Calculated: 117 mg/dL — ABNORMAL HIGH (ref 0–99)
Triglycerides: 177 mg/dL — ABNORMAL HIGH (ref 0–149)
VLDL Cholesterol Cal: 35 mg/dL (ref 5–40)

## 2017-04-25 ENCOUNTER — Ambulatory Visit (INDEPENDENT_AMBULATORY_CARE_PROVIDER_SITE_OTHER): Payer: Self-pay | Admitting: Physician Assistant

## 2017-04-25 ENCOUNTER — Ambulatory Visit (INDEPENDENT_AMBULATORY_CARE_PROVIDER_SITE_OTHER): Payer: Self-pay

## 2017-04-25 ENCOUNTER — Encounter: Payer: Self-pay | Admitting: Physician Assistant

## 2017-04-25 VITALS — BP 150/86 | HR 69 | Temp 98.5°F | Resp 16 | Ht 69.25 in | Wt 216.2 lb

## 2017-04-25 DIAGNOSIS — R05 Cough: Secondary | ICD-10-CM

## 2017-04-25 DIAGNOSIS — G479 Sleep disorder, unspecified: Secondary | ICD-10-CM

## 2017-04-25 DIAGNOSIS — R059 Cough, unspecified: Secondary | ICD-10-CM

## 2017-04-25 DIAGNOSIS — R030 Elevated blood-pressure reading, without diagnosis of hypertension: Secondary | ICD-10-CM

## 2017-04-25 DIAGNOSIS — R200 Anesthesia of skin: Secondary | ICD-10-CM

## 2017-04-25 DIAGNOSIS — R079 Chest pain, unspecified: Secondary | ICD-10-CM

## 2017-04-25 DIAGNOSIS — Z87898 Personal history of other specified conditions: Secondary | ICD-10-CM

## 2017-04-25 DIAGNOSIS — R202 Paresthesia of skin: Secondary | ICD-10-CM

## 2017-04-25 MED ORDER — ALPRAZOLAM 0.5 MG PO TABS: 0.5000 mg | ORAL_TABLET | Freq: Every evening | ORAL | 0 refills | Status: DC | PRN

## 2017-04-25 NOTE — Patient Instructions (Addendum)
Stop your Prilosec for the next 3 weeks. Come back and we will test you for H Pylori.  Patients should be advised to stop PPI therapy one to two weeks prior to testing. If feasible, testing should performed at least four weeks after completion of bismuth/antibiotic treatment  Your EKG and chest x-ray look great.   Thank you for coming in today. I hope you feel we met your needs. Feel free to call PCP if you have any questions or further requests. Please consider signing up for MyChart if you do not already have it, as this is a great way to communicate with me.  Best,  Whitney McVey, PA-C  Helicobacter Pylori Infection Helicobacter pylori infection is an infection in the stomach that is caused by the Helicobacter pylori (H. pylori) bacteria. This type of bacteria often lives in the lining of the stomach. The infection can cause ulcers and irritation (gastritis) in some people. It is the most common cause of ulcers in the stomach (gastric ulcer) and in the upper part of the intestine (duodenal ulcer). Having this infection may also increase the risk of stomach cancer and a type of white blood cell cancer (lymphoma) that affects the stomach. What are the causes? H. pylori is a type of bacteria that is often found in the stomachs of healthy people. The bacteria may be passed from person to person through contact with stool or saliva. It is not known why some people develop ulcers, gastritis, or cancer from the infection. What increases the risk? This condition is more likely to develop in people who:  Have family members with the infection.  Live with many other people, such as in a dormitory.  Are of African, Hispanic, or Asian descent.  What are the signs or symptoms? Most people with this infection do not have symptoms. If you do have symptoms, they may include:  Heartburn.  Stomach pain.  Nausea.  Vomiting.  Blood-tinged vomit.  Loss of appetite.  Bad breath.  How is this  diagnosed? This condition may be diagnosed based on your symptoms, a physical exam, and various tests. Tests may include:  Blood tests or stool tests to check for the proteins (antibodies) that your body may produce in response to the bacteria. These tests are the best way to confirm the diagnosis.  A breath test to check for the type of gas that the H. pylori bacteria release after breaking down a substance called urea. For the test, you are asked to drink urea. This test is often done after treatment in order to find out if the treatment worked.  A procedure in which a thin, flexible tube with a tiny camera at the end is placed into your stomach and upper intestine (upper endoscopy). Your health care provider may also take tissue samples (biopsy) to test for H. pylori and cancer.  How is this treated? Treatment for this condition usually involves taking a combination of medicines (triple therapy) for a couple of weeks. Triple therapy includes one medicine to reduce the acid in your stomach and two types of antibiotic medicines. Many drug combinations have been approved for treatment. Treatment usually kills the H. pylori and reduces your risk of cancer. You may need to be tested for H. pylori again after treatment. In some cases, the treatment may need to be repeated. Follow these instructions at home:  Take over-the-counter and prescription medicines only as told by your health care provider.  Take your antibiotics as told by your health  care provider. Do not stop taking the antibiotics even if you start to feel better.  You can do all your usual activities and eat what you usually do.  Take steps to prevent future infections: ? Wash your hands often. ? Make sure the food you eat has been properly prepared. ? Drink water only from clean sources.  Keep all follow-up visits as told by your health care provider. This is important. Contact a health care provider if:  Your symptoms do not  get better.  Your symptoms return after treatment. This information is not intended to replace advice given to you by your health care provider. Make sure you discuss any questions you have with your health care provider. Document Released: 10/25/2015 Document Revised: 12/09/2015 Document Reviewed: 07/15/2014 Elsevier Interactive Patient Education  2018 [employer].    IF you received an x-ray today, you will receive an invoice from [facility]. Please contact [facility] at [phone] with questions or concerns regarding your invoice.   IF you received labwork today, you will receive an invoice from [facility]. Please contact [facility] at [phone] with questions or concerns regarding your invoice.   Our billing staff will not be able to assist you with questions regarding bills from these companies.  You will be contacted with the lab results as soon as they are available. The fastest way to get your results is to activate your My Chart account. Instructions are located on the last page of this paperwork. If you have not heard from [place] regarding the results in 2 weeks, please contact this office.

## 2017-04-25 NOTE — Progress Notes (Signed)
Jacob Chen  MRN: [id number] DOB: 1966-05-10  PCP: Dorise Hiss, PA-C  Subjective:  Pt is a 51 year old male PMH HTN, smoking and chronic alcohol use who presents to clinic for chest pain.  "feels like a pull muscle" sharp pain, located upper-to- mid left side of chest x 1 wk. Constant, does not get better or worse. Endorses decreased appetite recently.  "feels hot". Endorses some cough, more persistent recently. Worse when laying down. Not bad when standing/sitting.   Endorses some increased daily anxiety - "maybe worse" recently.  Not sleeping well.  He is taking Prilosec and lisinopril daily.  Smoker 1/2 ppd x >20 years.   Denies back pain, fever, chills, shob, diaphoresis, neck pain, arm pain, n/v.   Review of Systems  Constitutional: Positive for appetite change. Negative for chills, diaphoresis, fatigue and fever.  Respiratory: Positive for cough.   Cardiovascular: Positive for chest pain.  Skin: Negative.   Psychiatric/Behavioral: Positive for sleep disturbance.    Patient Active Problem List   Diagnosis Date Noted  . Essential hypertension 10/04/2016    Current Outpatient Prescriptions on File Prior to Visit  Medication Sig Dispense Refill  . lisinopril (PRINIVIL,ZESTRIL) 20 MG tablet Take 1 tablet (20 mg total) by mouth daily. 90 tablet 0  . omeprazole (PRILOSEC) 20 MG capsule Take 1 capsule (20 mg total) by mouth daily. Take 30 min before meal 90 capsule 1   No current facility-administered medications on file prior to visit.     No Known Allergies   Objective:  BP (!) 150/86   Pulse 69   Temp 98.5 F (36.9 C) (Oral)   Resp 16   Ht 5' 9.25" (1.759 m)   Wt 216 lb 3.2 oz (98.1 kg)   SpO2 99%   BMI 31.70 kg/m   Physical Exam  Constitutional: He is oriented to person, place, and time and well-developed, well-nourished, and in no distress. No distress.  Cardiovascular: Normal rate, regular rhythm and normal heart sounds.   Pulmonary/Chest:  Effort normal. He has rhonchi in the right middle field and the right lower field.  Neurological: He is alert and oriented to person, place, and time. GCS score is 15.  Skin: Skin is warm and dry.  Psychiatric: Mood, memory, affect and judgment normal.  Vitals reviewed.  Dg Chest 2 View  Result Date: 04/25/2017 CLINICAL DATA:  Cough EXAM: CHEST  2 VIEW COMPARISON:  None. FINDINGS: Heart and mediastinal contours are within normal limits. No focal opacities or effusions. No acute bony abnormality. IMPRESSION: No active cardiopulmonary disease. Electronically Signed   By: Rolm Baptise M.D.   On: 04/25/2017 12:32    EKG - no S-T elevation or flipped t waves. Normal sinus rhythm. No hypertrophy.  Assessment and Plan :  1. Chest pain, unspecified type - EKG 12-Lead - DG Chest 2 View; Future - Negative chest x-ray, EKG is reassuring. Suspect possible H Pylori infection vs increased anxiety. Plan to d/c Prilosec x 3 weeks. RTC for H Pylori testing. Will Rx Xanax for sleep, which is more than likely contributing to his symptoms. Labs are pending. Will contact with results.  2. Numbness and tingling - Vitamin B12 - CBC with Differential/Platelet  3. Cough - DG Chest 2 View; Future  4. History of alcohol use - Vitamin B1 - CMP14+EGFR - Gamma GT  5. Elevated blood pressure reading - Recheck vitals  6. Difficulty sleeping - ALPRAZolam (XANAX) 0.5 MG tablet; Take 1 tablet (0.5 mg total)  by mouth at bedtime as needed for anxiety.  Dispense: 60 tablet; Refill: 0   Mercer Pod, PA-C  [facility] at [facility] 04/25/2017 11:54 AM

## 2017-04-27 ENCOUNTER — Encounter: Payer: Self-pay | Admitting: Physician Assistant

## 2017-04-27 LAB — CBC WITH DIFFERENTIAL/PLATELET
Basophils Absolute: 0 10*3/uL (ref 0.0–0.2)
Basos: 1 %
EOS (ABSOLUTE): 0.1 10*3/uL (ref 0.0–0.4)
Eos: 1 %
Hematocrit: 46.4 % (ref 37.5–51.0)
Hemoglobin: 15.9 g/dL (ref 13.0–17.7)
Immature Grans (Abs): 0 10*3/uL (ref 0.0–0.1)
Immature Granulocytes: 0 %
Lymphocytes Absolute: 2 10*3/uL (ref 0.7–3.1)
Lymphs: 30 %
MCH: 32.1 pg (ref 26.6–33.0)
MCHC: 34.3 g/dL (ref 31.5–35.7)
MCV: 94 fL (ref 79–97)
Monocytes Absolute: 0.3 10*3/uL (ref 0.1–0.9)
Monocytes: 5 %
Neutrophils Absolute: 4.1 10*3/uL (ref 1.4–7.0)
Neutrophils: 63 %
Platelets: 107 10*3/uL — ABNORMAL LOW (ref 150–379)
RBC: 4.96 x10E6/uL (ref 4.14–5.80)
RDW: 13.5 % (ref 12.3–15.4)
WBC: 6.5 10*3/uL (ref 3.4–10.8)

## 2017-04-27 LAB — CMP14+EGFR
ALT: 75 IU/L — ABNORMAL HIGH (ref 0–44)
AST: 45 IU/L — ABNORMAL HIGH (ref 0–40)
Albumin/Globulin Ratio: 1.8 (ref 1.2–2.2)
Albumin: 4.5 g/dL (ref 3.5–5.5)
Alkaline Phosphatase: 60 IU/L (ref 39–117)
BUN/Creatinine Ratio: 18 (ref 9–20)
BUN: 13 mg/dL (ref 6–24)
Bilirubin Total: 0.8 mg/dL (ref 0.0–1.2)
CO2: 24 mmol/L (ref 20–29)
Calcium: 9.5 mg/dL (ref 8.7–10.2)
Chloride: 102 mmol/L (ref 96–106)
Creatinine, Ser: 0.72 mg/dL — ABNORMAL LOW (ref 0.76–1.27)
GFR calc Af Amer: 124 mL/min/{1.73_m2} (ref >59)
GFR calc non Af Amer: 107 mL/min/{1.73_m2} (ref >59)
Globulin, Total: 2.5 g/dL (ref 1.5–4.5)
Glucose: 104 mg/dL — ABNORMAL HIGH (ref 65–99)
Potassium: 4.5 mmol/L (ref 3.5–5.2)
Sodium: 140 mmol/L (ref 134–144)
Total Protein: 7 g/dL (ref 6.0–8.5)

## 2017-04-27 LAB — GAMMA GT: GGT: 135 IU/L — ABNORMAL HIGH (ref 0–65)

## 2017-04-27 LAB — VITAMIN B1

## 2017-04-27 LAB — VITAMIN B12: Vitamin B-12: >2000 pg/mL — ABNORMAL HIGH (ref 232–1245)

## 2017-04-30 ENCOUNTER — Other Ambulatory Visit: Payer: Self-pay | Admitting: Physician Assistant

## 2017-04-30 ENCOUNTER — Encounter: Payer: Self-pay | Admitting: Physician Assistant

## 2017-04-30 DIAGNOSIS — R945 Abnormal results of liver function studies: Principal | ICD-10-CM

## 2017-04-30 DIAGNOSIS — R7989 Other specified abnormal findings of blood chemistry: Secondary | ICD-10-CM

## 2017-04-30 NOTE — Progress Notes (Signed)
Elevated LFT's and GGT. Will check hepatitis panel. OK to come in for LAB ONLY visit.

## 2017-04-30 NOTE — Addendum Note (Signed)
Addended by: Baldwin Crown D on: 04/30/2017 04:07 PM   Modules accepted: Orders

## 2017-05-16 ENCOUNTER — Ambulatory Visit (INDEPENDENT_AMBULATORY_CARE_PROVIDER_SITE_OTHER): Payer: Self-pay | Admitting: Physician Assistant

## 2017-05-16 ENCOUNTER — Encounter: Payer: Self-pay | Admitting: Physician Assistant

## 2017-05-16 VITALS — BP 138/92 | HR 81 | Temp 98.1°F | Resp 16 | Ht 69.25 in | Wt 217.6 lb

## 2017-05-16 DIAGNOSIS — R1013 Epigastric pain: Secondary | ICD-10-CM

## 2017-05-16 DIAGNOSIS — R7989 Other specified abnormal findings of blood chemistry: Secondary | ICD-10-CM

## 2017-05-16 DIAGNOSIS — R945 Abnormal results of liver function studies: Secondary | ICD-10-CM

## 2017-05-16 DIAGNOSIS — R202 Paresthesia of skin: Secondary | ICD-10-CM

## 2017-05-16 DIAGNOSIS — R5383 Other fatigue: Secondary | ICD-10-CM

## 2017-05-16 DIAGNOSIS — R200 Anesthesia of skin: Secondary | ICD-10-CM

## 2017-05-16 NOTE — Progress Notes (Signed)
   Jacob Chen  MRN: [id number] DOB: 05/15/1966  PCP: Sebastian AcheMcVey, Deah Ottaway Whitney, PA-C  Subjective:  Pt is a 51 year old male who presents to clinic for f/u abdominal pain.  He was here 2 weeks ago for chest pain - negative work-up.  Advised pt to d/c his PPI and RTC for H Pylori testing.  He has been paying close attention to the foods he eats which causes him abdominal pain and is curbing his intake. His has cut back on his drinking, this is helping a lot.    Xanax is helping anxiety.   Fatigue. C/o fatigue, however this has improved recently.   Review of Systems  Constitutional: Negative for chills and diaphoresis.  Gastrointestinal: Positive for abdominal pain. Negative for constipation, diarrhea, nausea and vomiting.  Psychiatric/Behavioral: The patient is nervous/anxious.     Patient Active Problem List   Diagnosis Date Noted  . Essential hypertension 10/04/2016    Current Outpatient Prescriptions on File Prior to Visit  Medication Sig Dispense Refill  . ALPRAZolam (XANAX) 0.5 MG tablet Take 1 tablet (0.5 mg total) by mouth at bedtime as needed for anxiety. 60 tablet 0  . lisinopril (PRINIVIL,ZESTRIL) 20 MG tablet Take 1 tablet (20 mg total) by mouth daily. 90 tablet 0  . omeprazole (PRILOSEC) 20 MG capsule Take 1 capsule (20 mg total) by mouth daily. Take 30 min before meal (Patient not taking: Reported on 05/16/2017) 90 capsule 1   No current facility-administered medications on file prior to visit.     No Known Allergies   Objective:  BP (!) 138/92   Pulse 81   Temp 98.1 F (36.7 C) (Oral)   Resp 16   Ht 5' 9.25" (1.759 m)   Wt 217 lb 9.6 oz (98.7 kg)   SpO2 100%   BMI 31.90 kg/m   Physical Exam  Constitutional: He is oriented to person, place, and time and well-developed, well-nourished, and in no distress. No distress.  Cardiovascular: Normal rate, regular rhythm and normal heart sounds.   Neurological: He is alert and oriented to person, place, and  time. GCS score is 15.  Skin: Skin is warm and dry.  Psychiatric: Mood, memory, affect and judgment normal.  Vitals reviewed.   Assessment and Plan :  1. Epigastric pain - H. pylori breath test - He is here today for f/u abdominal pain, H Pylori testing after d/cing PPI after last OV. He plans to con't stay off his PPI and treat abdominal pain with improved diet. OK to refill if he calls in.  2. Fatigue, unspecified type - Unsure if this is due to anxiety/depression. He plans to speak with a therapist.   3. Elevated LFTs - HepB+HepC+HIV Panel - Last OV lab results showed elevated GGT and low platelets. Plan to check liver panel. Consider ultrasound.  4. Numbness and tingling - Vitamin B1 - Lab was spun down and sent incorrectly to [facility] last OV. This was corrected in paperwork, will redraw.   Marco CollieWhitney Isaly Fasching, PA-C  Primary [facility] at [facility] 05/16/2017 8:19 AM

## 2017-05-16 NOTE — Patient Instructions (Signed)
     IF you received an x-ray today, you will receive an invoice from [facility]. Please contact [facility] at [phone] with questions or concerns regarding your invoice.   IF you received labwork today, you will receive an invoice from [facility]. Please contact [facility] at [phone] with questions or concerns regarding your invoice.   Our billing staff will not be able to assist you with questions regarding bills from these companies.  You will be contacted with the lab results as soon as they are available. The fastest way to get your results is to activate your My Chart account. Instructions are located on the last page of this paperwork. If you have not heard from us regarding the results in 2 weeks, please contact this office.     

## 2017-05-17 LAB — H. PYLORI BREATH TEST: H pylori Breath Test: NEGATIVE

## 2017-05-19 LAB — HEPB+HEPC+HIV PANEL
HIV Screen 4th Generation wRfx: NONREACTIVE
Hep B C IgM: NEGATIVE
Hep B Core Total Ab: NEGATIVE
Hep B E Ab: NEGATIVE
Hep B E Ag: NEGATIVE
Hep B Surface Ab, Qual: NONREACTIVE
Hep C Virus Ab: <0.1 {s_co_ratio} (ref 0.0–0.9)
Hepatitis B Surface Ag: NEGATIVE

## 2017-05-19 LAB — VITAMIN B1: Thiamine: 118.9 nmol/L (ref 66.5–200.0)

## 2017-05-28 ENCOUNTER — Encounter: Payer: Self-pay | Admitting: Physician Assistant

## 2017-06-05 NOTE — Telephone Encounter (Signed)
Hey there Kristi!  Can you please help me with this? I am hoping there is something I can do about this pt's [facility] bill. I was following up on +abdominal pain and elevated liver enzymes with a hepatitis panel and had no idea how much these labs cost. This pt does not have insurance and is now stuck with a $780 bill.  Dr. Clelia CroftShaw mentioned we may be able to help him.  I am so sorry I didn't realize these labs were so expensive. Thank you for helping me with this.  Best,  CitigroupWhitney Keyosha Tiedt, PA-C

## 2017-08-22 ENCOUNTER — Other Ambulatory Visit: Payer: Self-pay | Admitting: Physician Assistant

## 2017-08-22 DIAGNOSIS — G479 Sleep disorder, unspecified: Secondary | ICD-10-CM

## 2017-08-22 DIAGNOSIS — I10 Essential (primary) hypertension: Secondary | ICD-10-CM

## 2017-08-23 ENCOUNTER — Other Ambulatory Visit: Payer: Self-pay | Admitting: Physician Assistant

## 2017-08-23 DIAGNOSIS — G479 Sleep disorder, unspecified: Secondary | ICD-10-CM

## 2017-08-23 MED ORDER — LISINOPRIL 20 MG PO TABS: 20.0000 mg | ORAL_TABLET | Freq: Every day | ORAL | 0 refills | Status: DC

## 2017-08-28 ENCOUNTER — Telehealth: Payer: Self-pay

## 2017-08-28 MED ORDER — ALPRAZOLAM 0.5 MG PO TABS: 0.5000 mg | ORAL_TABLET | Freq: Every evening | ORAL | 0 refills | Status: DC | PRN

## 2017-08-28 NOTE — Telephone Encounter (Signed)
Rx for alprazolam 0.5 mg #60 with no refills- faxed to [facility] at [phone]. Confirmation fax received at 10:28 am.  Lm on vm requested rx  faxed over to pharmacy and if he has question to feel free to call office. Dgaddy, CMA

## 2017-08-31 NOTE — Telephone Encounter (Signed)
Refill request responded to by other means, see 08/28/17 telephone encounter. Closing note.

## 2017-09-20 ENCOUNTER — Encounter: Payer: Self-pay | Admitting: Physician Assistant

## 2017-09-25 ENCOUNTER — Other Ambulatory Visit: Payer: Self-pay | Admitting: Physician Assistant

## 2017-09-25 DIAGNOSIS — I10 Essential (primary) hypertension: Secondary | ICD-10-CM

## 2017-09-25 MED ORDER — LISINOPRIL 20 MG PO TABS: 20.0000 mg | ORAL_TABLET | Freq: Every day | ORAL | 0 refills | Status: DC

## 2017-09-25 NOTE — Telephone Encounter (Signed)
Pt. is requesting a 90 day supply of Lisinopril 20 mg.    Last office visit: 05/16/18 PCP : Marco CollieWhitney McVey [facility] [place]  Scheduled for 6 mo. F/u 10/03/17

## 2017-09-25 NOTE — Telephone Encounter (Signed)
Copied from CRM [id number]. Topic: Quick Communication - Rx Refill/Question >> Sep 25, 2017 12:06 PM Caryn SectionMorris, Dawnette Mione E, NT wrote: Medication: lisinopril (PRINIVIL,ZESTRIL) 20 MG tablet. Patient is requesting a 90 or 180 day supply. Pt said it cost him the same as a 30 day supply.   Has the patient contacted their pharmacy? Yes    (Agent: If no, request that the patient contact the pharmacy for the refill.)  [facility] [id number] - [place], [place] - [address] [phone] (Phone) [phone] (Fax)   Preferred Pharmacy (with phone number or street name):    Agent: Please be advised that RX refills may take up to 3 business days. We ask that you follow-up with your pharmacy.

## 2017-10-03 ENCOUNTER — Encounter: Payer: Self-pay | Admitting: Physician Assistant

## 2017-10-03 ENCOUNTER — Other Ambulatory Visit: Payer: Self-pay

## 2017-10-03 ENCOUNTER — Ambulatory Visit (INDEPENDENT_AMBULATORY_CARE_PROVIDER_SITE_OTHER): Payer: Self-pay | Admitting: Physician Assistant

## 2017-10-03 DIAGNOSIS — K219 Gastro-esophageal reflux disease without esophagitis: Secondary | ICD-10-CM

## 2017-10-03 DIAGNOSIS — I10 Essential (primary) hypertension: Secondary | ICD-10-CM

## 2017-10-03 DIAGNOSIS — G479 Sleep disorder, unspecified: Secondary | ICD-10-CM

## 2017-10-03 MED ORDER — ALPRAZOLAM 0.5 MG PO TABS: 0.5000 mg | ORAL_TABLET | Freq: Every evening | ORAL | 0 refills | Status: DC | PRN

## 2017-10-03 MED ORDER — LISINOPRIL 20 MG PO TABS: 20.0000 mg | ORAL_TABLET | Freq: Every day | ORAL | 3 refills | Status: DC

## 2017-10-03 NOTE — Patient Instructions (Addendum)
Come back in September for your annual exam.     Fat and Cholesterol Restricted Diet Getting too much fat and cholesterol in your diet may cause health problems. Following this diet helps keep your fat and cholesterol at normal levels. This can keep you from getting sick. What types of fat should I choose?  Choose monosaturated and polyunsaturated fats. These are found in foods such as olive oil, canola oil, flaxseeds, walnuts, almonds, and seeds.  Eat more omega-3 fats. Good choices include salmon, mackerel, sardines, tuna, flaxseed oil, and ground flaxseeds.  Limit saturated fats. These are in animal products such as meats, butter, and cream. They can also be in plant products such as palm oil, palm kernel oil, and coconut oil.  Avoid foods with partially hydrogenated oils in them. These contain trans fats. Examples of foods that have trans fats are stick margarine, some tub margarines, cookies, crackers, and other baked goods. What general guidelines do I need to follow?  Check food labels. Look for the words "trans fat" and "saturated fat."  When preparing a meal: ? Fill half of your plate with vegetables and green salads. ? Fill one fourth of your plate with whole grains. Look for the word "whole" as the first word in the ingredient list. ? Fill one fourth of your plate with lean protein foods.  Eat more foods that have fiber, like apples, carrots, beans, peas, and barley.  Eat more home-cooked foods. Eat less at restaurants and buffets.  Limit or avoid alcohol.  Limit foods high in starch and sugar.  Limit fried foods.  Cook foods without frying them. Baking, boiling, grilling, and broiling are all great options.  Lose weight if you are overweight. Losing even a small amount of weight can help your overall health. It can also help prevent diseases such as diabetes and heart disease. What foods can I eat? Grains Whole grains, such as whole wheat or whole grain breads,  crackers, cereals, and pasta. Unsweetened oatmeal, bulgur, barley, quinoa, or brown rice. Corn or whole wheat flour tortillas. Vegetables Fresh or frozen vegetables (raw, steamed, roasted, or grilled). Green salads. Fruits All fresh, canned (in natural juice), or frozen fruits. Meat and Other Protein Products Ground beef (85% or leaner), grass-fed beef, or beef trimmed of fat. Skinless chicken or [place]. Ground chicken or [place]. Pork trimmed of fat. All fish and seafood. Eggs. Dried beans, peas, or lentils. Unsalted nuts or seeds. Unsalted canned or dry beans. Dairy Low-fat dairy products, such as skim or 1% milk, 2% or reduced-fat cheeses, low-fat ricotta or cottage cheese, or plain low-fat yogurt. Fats and Oils Tub margarines without trans fats. Light or reduced-fat mayonnaise and salad dressings. Avocado. Olive, canola, sesame, or safflower oils. Natural peanut or almond butter (choose ones without added sugar and oil). The items listed above may not be a complete list of recommended foods or beverages. Contact your dietitian for more options. What foods are not recommended? Grains White bread. White pasta. White rice. Cornbread. Bagels, pastries, and croissants. Crackers that contain trans fat. Vegetables White potatoes. Corn. Creamed or fried vegetables. Vegetables in a cheese sauce. Fruits Dried fruits. Canned fruit in light or heavy syrup. Fruit juice. Meat and Other Protein Products Fatty cuts of meat. Ribs, chicken wings, bacon, sausage, bologna, salami, chitterlings, fatback, hot dogs, bratwurst, and packaged luncheon meats. Liver and organ meats. Dairy Whole or 2% milk, cream, half-and-half, and cream cheese. Whole milk cheeses. Whole-fat or sweetened yogurt. Full-fat cheeses. Nondairy creamers and  whipped toppings. Processed cheese, cheese spreads, or cheese curds. Sweets and Desserts Corn syrup, sugars, honey, and molasses. Candy. Jam and jelly. Syrup. Sweetened cereals.  Cookies, pies, cakes, donuts, muffins, and ice cream. Fats and Oils Butter, stick margarine, lard, shortening, ghee, or bacon fat. Coconut, palm kernel, or palm oils. Beverages Alcohol. Sweetened drinks (such as sodas, lemonade, and fruit drinks or punches). The items listed above may not be a complete list of foods and beverages to avoid. Contact your dietitian for more information. This information is not intended to replace advice given to you by your health care provider. Make sure you discuss any questions you have with your health care provider. Document Released: 01/02/2012 Document Revised: 03/09/2016 Document Reviewed: 10/02/2013 Elsevier Interactive Patient Education  2018 [employer]  IF you received an x-ray today, you will receive an invoice from [facility]. Please contact [facility] at [phone] with questions or concerns regarding your invoice.   IF you received labwork today, you will receive an invoice from [facility]. Please contact [facility] at [phone] with questions or concerns regarding your invoice.   Our billing staff will not be able to assist you with questions regarding bills from these companies.  You will be contacted with the lab results as soon as they are available. The fastest way to get your results is to activate your My Chart account. Instructions are located on the last page of this paperwork. If you have not heard from us regarding the results in 2 weeks, please contact this office.

## 2017-10-03 NOTE — Progress Notes (Signed)
   Mauri ReadingJohn A Hendershott  MRN: [id number] DOB: 11/30/1965  PCP: Sebastian AcheMcVey, Angad Nabers Whitney, PA-C  Subjective:  Pt is a 52 year old male PMH HTN who presents to clinic for medication refill. He is doing well today.   HTN - lisinopril 20mg  qd. Today's blood pressure is 130/96. Medication is cheaper with 90 pills. He is still smoking, smokes half pack/day.    Anxiety and difficulty sleeping - takes Xanax 0.5mg  PRN HS.   GERD - controlled with omeprazole 20mg  before breakfast. Negative H Pylori 05/16/2017.   Review of Systems  Constitutional: Negative for chills and diaphoresis.  Respiratory: Negative for cough, chest tightness, shortness of breath and wheezing.   Cardiovascular: Negative for chest pain, palpitations and leg swelling.  Gastrointestinal: Negative for diarrhea, nausea and vomiting.  Musculoskeletal: Negative for neck pain.  Neurological: Negative for dizziness, syncope, light-headedness and headaches.  Psychiatric/Behavioral: Negative for sleep disturbance. The patient is not nervous/anxious.     Patient Active Problem List   Diagnosis Date Noted  . Essential hypertension 10/04/2016    Current Outpatient Medications on File Prior to Visit  Medication Sig Dispense Refill  . ALPRAZolam (XANAX) 0.5 MG tablet Take 1 tablet (0.5 mg total) by mouth at bedtime as needed for anxiety. 60 tablet 0  . lisinopril (PRINIVIL,ZESTRIL) 20 MG tablet Take 1 tablet (20 mg total) by mouth daily. Office visit needed 90 tablet 0  . omeprazole (PRILOSEC) 20 MG capsule Take 1 capsule (20 mg total) by mouth daily. Take 30 min before meal 90 capsule 1   No current facility-administered medications on file prior to visit.     No Known Allergies   Objective:  BP (!) 130/96   Pulse 77   Temp 98.2 F (36.8 C) (Oral)   Resp 16   Ht 5\' 10"  (1.778 m)   Wt 230 lb 6.4 oz (104.5 kg)   SpO2 98%   BMI 33.06 kg/m   Physical Exam  Constitutional: He is oriented to person, place, and time and  well-developed, well-nourished, and in no distress. No distress.  Cardiovascular: Normal rate, regular rhythm and normal heart sounds.  Neurological: He is alert and oriented to person, place, and time. GCS score is 15.  Skin: Skin is warm and dry.  Psychiatric: Mood, memory, affect and judgment normal.  Vitals reviewed.   Assessment and Plan :  1. Essential hypertension - lisinopril (PRINIVIL,ZESTRIL) 20 MG tablet; Take 1 tablet (20 mg total) by mouth daily. Office visit needed  Dispense: 90 tablet; Refill: 3 - Blood pressure controlled. OK to refill. RTC in 6-9 months for annual exam. Plan to check routine labs at that time. Encouraged DASH diet.  2. Difficulty sleeping - ALPRAZolam (XANAX) 0.5 MG tablet; Take 1 tablet (0.5 mg total) by mouth at bedtime as needed for anxiety.  Dispense: 60 tablet; Refill: 0  3. Gastroesophageal reflux disease, esophagitis presence not specified - omeprazole (PRILOSEC) 20 MG capsule; Take 1 capsule (20 mg total) by mouth daily. Take 30 min before meal  Dispense: 90 capsule; Refill: 2  Marco CollieWhitney Lalanya Rufener, PA-C  [facility] at [facility] 10/03/2017 8:17 AM

## 2017-10-08 MED ORDER — OMEPRAZOLE 20 MG PO CPDR: 20.0000 mg | DELAYED_RELEASE_CAPSULE | Freq: Every day | ORAL | 2 refills | Status: DC

## 2018-01-01 ENCOUNTER — Encounter: Payer: Self-pay | Admitting: Physician Assistant

## 2018-01-04 ENCOUNTER — Other Ambulatory Visit: Payer: Self-pay | Admitting: Physician Assistant

## 2018-01-04 DIAGNOSIS — F329 Major depressive disorder, single episode, unspecified: Secondary | ICD-10-CM

## 2018-01-04 DIAGNOSIS — F32A Depression, unspecified: Secondary | ICD-10-CM

## 2018-01-04 MED ORDER — FLUOXETINE HCL 20 MG PO TABS: 20.0000 mg | ORAL_TABLET | Freq: Every day | ORAL | 3 refills | Status: DC

## 2018-02-06 ENCOUNTER — Encounter: Payer: Self-pay | Admitting: Physician Assistant

## 2018-02-06 ENCOUNTER — Other Ambulatory Visit: Payer: Self-pay | Admitting: Physician Assistant

## 2018-02-06 DIAGNOSIS — G479 Sleep disorder, unspecified: Secondary | ICD-10-CM

## 2018-02-07 MED ORDER — ALPRAZOLAM 0.5 MG PO TABS: 0.5000 mg | ORAL_TABLET | Freq: Every evening | ORAL | 0 refills | Status: AC | PRN

## 2018-02-07 NOTE — Telephone Encounter (Signed)
LV 10/03/2017 Pt is requesting Xanax for bedtime use

## 2018-02-12 ENCOUNTER — Telehealth: Payer: Self-pay | Admitting: Physician Assistant

## 2018-02-12 ENCOUNTER — Other Ambulatory Visit: Payer: Self-pay

## 2018-02-12 ENCOUNTER — Encounter: Payer: Self-pay | Admitting: Physician Assistant

## 2018-02-12 ENCOUNTER — Ambulatory Visit: Payer: Self-pay | Admitting: Physician Assistant

## 2018-02-12 DIAGNOSIS — F32A Depression, unspecified: Secondary | ICD-10-CM

## 2018-02-12 DIAGNOSIS — F329 Major depressive disorder, single episode, unspecified: Secondary | ICD-10-CM

## 2018-02-12 MED ORDER — BUPROPION HCL ER (XL) 150 MG PO TB24: 150.0000 mg | ORAL_TABLET | Freq: Every day | ORAL | 3 refills | Status: DC

## 2018-02-12 MED ORDER — FLUOXETINE HCL 60 MG PO TABS: 60.0000 mg | ORAL_TABLET | Freq: Every day | ORAL | 5 refills | Status: DC

## 2018-02-12 NOTE — Progress Notes (Signed)
Jacob Chen  MRN: [id number] DOB: 1965/12/23  PCP: Sebastian Ache, PA-C  Subjective:  Pt is a 51 year old male who presents to clinic for f/u anxiety and depression.  Started Prozac 40mg  qd. Up to 60mg  daily.  This is the first week he has started to feel good.  Endorses decreased appetite, is eating about 1 meal a day.  Every day he feels nausea. Recently started dating a woman from his past which has chondroid up a lot of feelings.  He believes he has had commitment issues his whole life, which he now believes is due to depression. Denies self-harm or suicidal ideation. Sees therapist weekly.  Was on Lexapro several years ago. Cannot recall if this worked.   He has an appointment with me on Sept 25  Review of Systems  Constitutional: Positive for appetite change (decreased appetite. ).  Psychiatric/Behavioral: Positive for dysphoric mood. Negative for self-injury and suicidal ideas.    Patient Active Problem List   Diagnosis Date Noted  . Essential hypertension 10/04/2016    Current Outpatient Medications on File Prior to Visit  Medication Sig Dispense Refill  . ALPRAZolam (XANAX) 0.5 MG tablet Take 1 tablet (0.5 mg total) by mouth at bedtime as needed for anxiety. 60 tablet 0  . FLUoxetine (PROZAC) 20 MG tablet Take 1 tablet (20 mg total) by mouth daily. If no improvement after 3 weeks, you may increase to 40mg /day. 60 tablet 3  . lisinopril (PRINIVIL,ZESTRIL) 20 MG tablet Take 20 mg by mouth daily.    Marland Kitchen omeprazole (PRILOSEC) 20 MG capsule Take 1 capsule (20 mg total) by mouth daily. Take 30 min before meal 90 capsule 2  . lisinopril (PRINIVIL,ZESTRIL) 20 MG tablet Take 1 tablet (20 mg total) by mouth daily. Office visit needed 90 tablet 3   No current facility-administered medications on file prior to visit.     No Known Allergies   Objective:  BP 134/80 (BP Location: Left Arm, Patient Position: Sitting, Cuff Size: Normal)   Pulse 74   Temp 98.1 F (36.7  C) (Oral)   Resp 16   Ht 5\' 10"  (1.778 m)   Wt 209 lb 3.2 oz (94.9 kg)   SpO2 97%   BMI 30.02 kg/m   Physical Exam  Constitutional: He is oriented to person, place, and time. He appears well-developed and well-nourished.  Cardiovascular: Normal rate and regular rhythm.  Pulmonary/Chest: Effort normal. No respiratory distress.  Neurological: He is alert and oriented to person, place, and time.  Skin: Skin is warm and dry.  Psychiatric: He has a normal mood and affect. His behavior is normal. Judgment and thought content normal.  Vitals reviewed.   Assessment and Plan :  1. Depression, unspecified depression type -Patient presents for follow-up depression.  Denies SI or HI.  He is seeing a therapist weekly.  Continue Prozac 60 mg a day.  Add Wellbutrin 150 mg daily with instruction to increase to twice daily if needed after 1 week.  He will be in contact with me via my chart until his follow-up appointment with me on September 25. - FLUoxetine 60 MG TABS; Take 60 mg by mouth daily. If no improvement after 3 weeks, you may increase to 40mg /day.  Dispense: 30 tablet; Refill: 5 - buPROPion (WELLBUTRIN XL) 150 MG 24 hr tablet; Take 1 tablet (150 mg total) by mouth daily. May increase to 150mg  twice daily after 2 weeks if needed  Dispense: 60 tablet; Refill: 3    Whitney Tevis Dunavan,  PA-C  [facility] at [facility] 02/12/2018 8:40 AM  Please note: Portions of this report may have been transcribed using dragon voice recognition software. Every effort was made to ensure accuracy; however, inadvertent computerized transcription errors may be present.

## 2018-02-12 NOTE — Telephone Encounter (Signed)
Copied from CRM [id number]. Topic: General - Other >> Feb 12, 2018 12:06 PM Percival SpanishKennedy, Cheryl W wrote:  [facility] strength and clarification about the below med    FLUoxetine 60 MG TABS   [phone]

## 2018-02-12 NOTE — Patient Instructions (Addendum)
I am prescribing Wellbutrin extended release (bc this is only once/day to start) with a note to your pharmacy that it is okay to fill immediate release if there is a big difference in pricing.... Just let me know which one you end up with bc the doses are different.   Extended release:  Start taking 150 mg once daily in the morning; if tolerated (or if needed), after 2 weeks, you may increase to 150 mg twice daily; if no improvement after 3 weeks, you may increase to a maximum dose of 200 mg twice daily (let me know if you would like me to fill at this dose).   Immediate release:  100 mg twice daily; after 1 week you can increase the dose to 100 mg 3 times a day; if no improvement after 3 weeks, you may increase to a maximum dose of 161m three times/day  (do not exceed 150 mg in a single dose.)  Bupropion tablets (Depression/Mood Disorders) What is this medicine? BUPROPION (byoo PROE pee on) is used to treat depression. This medicine may be used for other purposes; ask your health care provider or pharmacist if you have questions. COMMON BRAND NAME(S): Wellbutrin What should I tell my health care provider before I take this medicine? They need to know if you have any of these conditions: -an eating disorder, such as anorexia or bulimia -bipolar disorder or psychosis -diabetes or high blood sugar, treated with medication -glaucoma -heart disease, previous heart attack, or irregular heart beat -head injury or brain tumor -high blood pressure -kidney or liver disease -seizures -suicidal thoughts or a previous suicide attempt -Tourette's syndrome -weight loss -an unusual or allergic reaction to bupropion, other medicines, foods, dyes, or preservatives -breast-feeding -pregnant or trying to become pregnant How should I use this medicine? Take this medicine by mouth with a glass of water. Follow the directions on the prescription label. You can take it with or without food. If it upsets  your stomach, take it with food. Take your medicine at regular intervals. Do not take your medicine more often than directed. Do not stop taking this medicine suddenly except upon the advice of your doctor. Stopping this medicine too quickly may cause serious side effects or your condition may worsen. A special MedGuide will be given to you by the pharmacist with each prescription and refill. Be sure to read this information carefully each time. Talk to your pediatrician regarding the use of this medicine in children. Special care may be needed. Overdosage: If you think you have taken too much of this medicine contact a poison control center or emergency room at once. NOTE: This medicine is only for you. Do not share this medicine with others. What if I miss a dose? If you miss a dose, take it as soon as you can. If it is less than four hours to your next dose, take only that dose and skip the missed dose. Do not take double or extra doses. What may interact with this medicine? Do not take this medicine with any of the following medications: -linezolid -MAOIs like Azilect, Carbex, Eldepryl, Marplan, Nardil, and Parnate -methylene blue (injected into a vein) -other medicines that contain bupropion like Zyban This medicine may also interact with the following medications: -alcohol -certain medicines for anxiety or sleep -certain medicines for blood pressure like metoprolol, propranolol -certain medicines for depression or psychotic disturbances -certain medicines for HIV or AIDS like efavirenz, lopinavir, nelfinavir, ritonavir -certain medicines for irregular heart beat like propafenone,  flecainide -certain medicines for Parkinson's disease like amantadine, levodopa -certain medicines for seizures like carbamazepine, phenytoin, phenobarbital -cimetidine -clopidogrel -cyclophosphamide -digoxin -furazolidone -isoniazid -nicotine -orphenadrine -procarbazine -steroid medicines like  prednisone or cortisone -stimulant medicines for attention disorders, weight loss, or to stay awake -tamoxifen -theophylline -thiotepa -ticlopidine -tramadol -warfarin This list may not describe all possible interactions. Give your health care provider a list of all the medicines, herbs, non-prescription drugs, or dietary supplements you use. Also tell them if you smoke, drink alcohol, or use illegal drugs. Some items may interact with your medicine. What should I watch for while using this medicine? Tell your doctor if your symptoms do not get better or if they get worse. Visit your doctor or health care professional for regular checks on your progress. Because it may take several weeks to see the full effects of this medicine, it is important to continue your treatment as prescribed by your doctor. Patients and their families should watch out for new or worsening thoughts of suicide or depression. Also watch out for sudden changes in feelings such as feeling anxious, agitated, panicky, irritable, hostile, aggressive, impulsive, severely restless, overly excited and hyperactive, or not being able to sleep. If this happens, especially at the beginning of treatment or after a change in dose, call your health care professional. Avoid alcoholic drinks while taking this medicine. Drinking excessive alcoholic beverages, using sleeping or anxiety medicines, or quickly stopping the use of these agents while taking this medicine may increase your risk for a seizure. Do not drive or use heavy machinery until you know how this medicine affects you. This medicine can impair your ability to perform these tasks. Do not take this medicine close to bedtime. It may prevent you from sleeping. Your mouth may get dry. Chewing sugarless gum or sucking hard candy, and drinking plenty of water may help. Contact your doctor if the problem does not go away or is severe. What side effects may I notice from receiving this  medicine? Side effects that you should report to your doctor or health care professional as soon as possible: -allergic reactions like skin rash, itching or hives, swelling of the face, lips, or tongue -breathing problems -changes in vision -confusion -elevated mood, decreased need for sleep, racing thoughts, impulsive behavior -fast or irregular heartbeat -hallucinations, loss of contact with reality -increased blood pressure -redness, blistering, peeling or loosening of the skin, including inside the mouth -seizures -suicidal thoughts or other mood changes -unusually weak or tired -vomiting Side effects that usually do not require medical attention (report to your doctor or health care professional if they continue or are bothersome): -constipation -headache -loss of appetite -nausea -tremors -weight loss This list may not describe all possible side effects. Call your doctor for medical advice about side effects. You may report side effects to FDA at 1-800-FDA-1088. Where should I keep my medicine? Keep out of the reach of children. Store at room temperature between 20 and 25 degrees C (68 and 77 degrees F), away from direct sunlight and moisture. Keep tightly closed. Throw away any unused medicine after the expiration date. NOTE: This sheet is a summary. It may not cover all possible information. If you have questions about this medicine, talk to your doctor, pharmacist, or health care provider.  2018 Elsevier/Gold Standard (2015-12-24 13:44:21)  Thank you for coming in today. I hope you feel we met your needs.  Feel free to call PCP if you have any questions or further requests.  Please consider signing  up for MyChart if you do not already have it, as this is a great way to communicate with me.  Best,  Whitney McVey, PA-C  IF you received an x-ray today, you will receive an invoice from [facility]. Please contact [facility] at [phone] with questions  or concerns regarding your invoice.   IF you received labwork today, you will receive an invoice from [facility]. Please contact [facility] at [phone] with questions or concerns regarding your invoice.   Our billing staff will not be able to assist you with questions regarding bills from these companies.  You will be contacted with the lab results as soon as they are available. The fastest way to get your results is to activate your My Chart account. Instructions are located on the last page of this paperwork. If you have not heard from [place] regarding the results in 2 weeks, please contact this office.

## 2018-02-13 NOTE — Telephone Encounter (Signed)
Please advise 

## 2018-02-14 ENCOUNTER — Encounter: Payer: Self-pay | Admitting: Physician Assistant

## 2018-02-19 ENCOUNTER — Other Ambulatory Visit: Payer: Self-pay | Admitting: Physician Assistant

## 2018-02-19 DIAGNOSIS — F32A Depression, unspecified: Secondary | ICD-10-CM

## 2018-02-19 DIAGNOSIS — F329 Major depressive disorder, single episode, unspecified: Secondary | ICD-10-CM

## 2018-02-19 MED ORDER — FLUOXETINE HCL 20 MG PO CAPS: 60.0000 mg | ORAL_CAPSULE | Freq: Every day | ORAL | 3 refills | Status: DC

## 2018-04-10 ENCOUNTER — Other Ambulatory Visit: Payer: Self-pay

## 2018-04-10 ENCOUNTER — Ambulatory Visit: Payer: Self-pay | Admitting: Physician Assistant

## 2018-04-10 ENCOUNTER — Encounter: Payer: Self-pay | Admitting: Physician Assistant

## 2018-04-10 VITALS — BP 156/88 | HR 67 | Temp 98.0°F | Resp 18 | Ht 69.69 in | Wt 190.4 lb

## 2018-04-10 DIAGNOSIS — Z23 Encounter for immunization: Secondary | ICD-10-CM

## 2018-04-10 DIAGNOSIS — Z1211 Encounter for screening for malignant neoplasm of colon: Secondary | ICD-10-CM

## 2018-04-10 DIAGNOSIS — Z0000 Encounter for general adult medical examination without abnormal findings: Secondary | ICD-10-CM

## 2018-04-10 DIAGNOSIS — Z125 Encounter for screening for malignant neoplasm of prostate: Secondary | ICD-10-CM

## 2018-04-10 DIAGNOSIS — R030 Elevated blood-pressure reading, without diagnosis of hypertension: Secondary | ICD-10-CM

## 2018-04-10 DIAGNOSIS — I10 Essential (primary) hypertension: Secondary | ICD-10-CM

## 2018-04-10 DIAGNOSIS — Z1329 Encounter for screening for other suspected endocrine disorder: Secondary | ICD-10-CM

## 2018-04-10 DIAGNOSIS — Z13228 Encounter for screening for other metabolic disorders: Secondary | ICD-10-CM

## 2018-04-10 DIAGNOSIS — Z130 Encounter for screening for diseases of the blood and blood-forming organs and certain disorders involving the immune mechanism: Secondary | ICD-10-CM

## 2018-04-10 LAB — POCT URINALYSIS DIP (MANUAL ENTRY)
Bilirubin, UA: NEGATIVE
Glucose, UA: NEGATIVE mg/dL
Ketones, POC UA: NEGATIVE mg/dL
Leukocytes, UA: NEGATIVE
Nitrite, UA: NEGATIVE
Protein Ur, POC: NEGATIVE mg/dL
Spec Grav, UA: 1.025 (ref 1.010–1.025)
Urobilinogen, UA: 1 U/dL
pH, UA: 6 (ref 5.0–8.0)

## 2018-04-10 MED ORDER — LISINOPRIL 30 MG PO TABS: 30.0000 mg | ORAL_TABLET | Freq: Every day | ORAL | 3 refills | Status: DC

## 2018-04-10 NOTE — Patient Instructions (Addendum)
I am so happy you are doing so well! Contact [employer] about self-pay costs.  I'll send you a letter with lab results.   Keeping You Healthy   Get These Tests  Blood Pressure- Have your blood pressure checked by your healthcare provider at least once a year.  Normal blood pressure is 120/80.  Weight- Have your body mass index (BMI) calculated to screen for obesity.  BMI is a measure of body fat based on height and weight.  You can calculate your own BMI at https://www.west-esparza.com/  Cholesterol- Have your cholesterol checked every year.  Diabetes- Have your blood sugar checked every year if you have high blood pressure, high cholesterol, a family history of diabetes or if you are overweight.  Screening for Colon Cancer- Colonoscopy starting at age 32. Screening may begin sooner depending on your family history and other health conditions.  Follow up colonoscopy as directed by your Gastroenterologist.  Screening for Osteoporosis- Screening begins at age 35 with bone density scanning, sooner if you are at higher risk for developing Osteoporosis.   Get these medicines  Calcium with Vitamin D- Your body requires 1200-1500 mg of Calcium a day and [phone] IU of Vitamin D a day.  You can only absorb 500 mg of Calcium at a time therefore Calcium must be taken in 2 or 3 separate doses throughout the day.  Hormones- Hormone therapy has been associated with increased risk for certain cancers and heart disease.  Talk to your healthcare provider about if you need relief from menopausal symptoms.  Aspirin- Ask your healthcare provider about taking Aspirin to prevent Heart Disease and Stroke.   Get these Immuniztions  Flu shot- Every fall  Pneumonia shot- Once after the age of 10; if you are younger ask your healthcare provider if you need a pneumonia shot.  Tetanus- Every ten years.  Zostavax- Once after the age of 81 to prevent shingles.   Take these steps  Don't smoke- Your healthcare  provider can help you quit. For tips on how to quit, ask your healthcare provider or go to www.smokefree.gov or call 1-800 QUIT-NOW.  Be physically active- Exercise 5 days a week for a minimum of 30 minutes.  If you are not already physically active, start slow and gradually work up to 30 minutes of moderate physical activity.  Try walking, dancing, bike riding, swimming, etc.  Eat a healthy diet- Eat a variety of healthy foods such as fruits, vegetables, whole grains, low fat milk, low fat cheeses, yogurt, lean meats, chicken, fish, eggs, dried beans, tofu, etc.  For more information go to www.thenutritionsource.org  Dental visit- Brush and floss teeth twice daily; visit your dentist twice a year.  Eye exam- Visit your Optometrist or Ophthalmologist yearly.  Drink alcohol in moderation- Limit alcohol intake to one drink or less a day.  Never drink and drive.  Depression- Your emotional health is as important as your physical health.  If you're feeling down or losing interest in things you normally enjoy, please talk to your healthcare provider.  Seat Belts- can save your life; always wear one  Smoke/Carbon Monoxide detectors- These detectors need to be installed on the appropriate level of your home.  Replace batteries at least once a year.  Violence- If anyone is threatening or hurting you, please tell your healthcare provider.  Living Will/ Health care power of attorney- Discuss with your healthcare provider and family.   If you have lab work done today you will be contacted with your  lab results within the next 2 weeks.  If you have not heard from us then please contact us. The fastest way to get your results is to register for My Chart.   IF you received an x-ray today, you will receive an invoice from [facility]. Please contact [facility] at [phone] with questions or concerns regarding your invoice.   IF you received labwork today, you will receive an  invoice from [facility]. Please contact [facility] at [phone] with questions or concerns regarding your invoice.   Our billing staff will not be able to assist you with questions regarding bills from these companies.  You will be contacted with the lab results as soon as they are available. The fastest way to get your results is to activate your My Chart account. Instructions are located on the last page of this paperwork. If you have not heard from us regarding the results in 2 weeks, please contact this office.

## 2018-04-10 NOTE — Progress Notes (Signed)
Primary Care at [place], [place] [address] [phone]  Date:  04/10/2018   Name:  Jacob Chen   DOB:  06-26-1966   MRN:  [id number]  PCP:  Dorise Hiss, PA-C    Chief Complaint: Annual Exam   History of Present Illness:  This is a 52 y.o. male with PMH GERD, depression and anxiety who is presenting for CPE. He has two daughters  He is fasting this morning.  Depression - still seeing therapist every few weeks.  Prozac 8m, wellbutrin 150 mg, Xanax 0.5 mg.  Not taking xanax. Doesn't need it.  Wellbutrin 1565mdaily Prozac 6068maily Lost about 40lbs over the past 6 months 2/2 depression. Today he is "feeling the best I've felt in a long time"  GERD - Prilosec 59m4my 30 min before breakfast.   HTN - Lisinopril 59mg37mme blood pressures are usually "a tick on the high side". Today's blood pressure is 156/88. Denies lightheadedness, dizziness, chronic headache, double vision, chest pain, shortness of breath, heart racing, palpitations, nausea, vomiting, abdominal pain, hematuria, lower leg swelling.  Complaints: none Immunizations: needs flu. Declines tdap  Eye: 20/20 b/l Diet: has not had appetite 2/2 GERD. Lost 30 lbs.  Exercise: walks 25 miles/week  Sexual hx: active with one partner only. Not interested in STD screening  Urinary hesitancy/frequency or nocturia: none Problems with erectile dysfunction: none Tobacco/alcohol/substance use: smokes 1/2 ppd x 30 years; 15 drinks/week of liquor; no substance abuse.   Colonoscopy: never. Declines colonoscopy.   Wt Readings from Last 3 Encounters:  04/10/18 190 lb 6.4 oz (86.4 kg)  02/12/18 209 lb 3.2 oz (94.9 kg)  10/03/17 230 lb 6.4 oz (104.5 kg)   Review of Systems:  Review of Systems  Constitutional: Negative for activity change, appetite change and fatigue.  HENT: Negative for congestion, dental problem, sneezing and tinnitus.   Eyes: Negative for visual disturbance.  Respiratory:  Negative for cough, chest tightness, shortness of breath and wheezing.   Cardiovascular: Negative for chest pain, palpitations and leg swelling.  Gastrointestinal: Positive for abdominal pain. Negative for blood in stool, constipation, diarrhea, nausea and vomiting.  Endocrine: Negative for polydipsia, polyphagia and polyuria.  Genitourinary: Negative for decreased urine volume, difficulty urinating, discharge, hematuria, scrotal swelling and testicular pain.  Musculoskeletal: Negative for arthralgias, back pain and neck stiffness.  Allergic/Immunologic: Negative for environmental allergies and food allergies.  Neurological: Negative for dizziness, syncope, weakness, light-headedness and headaches.  Psychiatric/Behavioral: Positive for dysphoric mood. Negative for sleep disturbance. The patient is nervous/anxious.    Patient Active Problem List   Diagnosis Date Noted  . Essential hypertension 10/04/2016   Prior to Admission medications   Medication Sig Start Date End Date Taking? Authorizing Provider  ALPRAZolam (XANADuanne Moron MG tablet Take 1 tablet (0.5 mg total) by mouth at bedtime as needed for anxiety. 02/07/18  Yes Cherrell Maybee, ElizaGelene MinkC  buPROPion (WELLBUTRIN XL) 150 MG 24 hr tablet Take 1 tablet (150 mg total) by mouth daily. May increase to 150mg 58me daily after 2 weeks if needed 02/12/18  Yes Dontai Pember, ElizabGelene Mink  FLUoxetine (PROZAC) 20 MG capsule Take 3 capsules (60 mg total) by mouth daily. 02/19/18  Yes Ananth Fiallos, ElizabGelene Mink  lisinopril (PRINIVIL,ZESTRIL) 20 MG tablet Take 20 mg by mouth daily.   Yes [provider]  omeprazole (PRILOSEC) 20 MG capsule Take 1 capsule (20 mg total) by mouth daily. Take 30 min before meal 10/08/17  Yes Joanie Duprey, ElizabGelene Mink  PA-C  lisinopril (PRINIVIL,ZESTRIL) 20 MG tablet Take 1 tablet (20 mg total) by mouth daily. Office visit needed 10/03/17 01/01/18  Nikos Anglemyer, Gelene Mink, PA-C    No Known Allergies  Social  History   Tobacco Use  . Smoking status: Current Every Day Smoker    Packs/day: 0.50    Years: 30.00    Pack years: 15.00  . Smokeless tobacco: Never Used  Substance Use Topics  . Alcohol use: Yes    Alcohol/week: 8.0 standard drinks    Types: 8 Shots of liquor per week  . Drug use: Not on file    Family History  Problem Relation Age of Onset  . Cancer Mother   . Hypertension Brother   . Cancer Brother    Medication list has been reviewed and updated.  Physical Examination:  Physical Exam  Constitutional: He is oriented to person, place, and time. No distress.  HENT:  Head: Normocephalic and atraumatic.  Right Ear: External ear normal.  Left Ear: External ear normal.  Nose: Nose normal.  Mouth/Throat: Oropharynx is clear and moist. No oropharyngeal exudate.  Eyes: Pupils are equal, round, and reactive to light. Conjunctivae and EOM are normal. Right eye exhibits no discharge. No scleral icterus.  Neck: Normal range of motion. Neck supple. No tracheal deviation present. No thyromegaly present.  Cardiovascular: Normal rate, regular rhythm and intact distal pulses.  No murmur heard. Pulmonary/Chest: Effort normal and breath sounds normal. No respiratory distress. He has no wheezes.  Abdominal: Soft. Bowel sounds are normal. He exhibits no distension and no mass. There is no tenderness.  Genitourinary: Penis normal. No discharge found.  Musculoskeletal: Normal range of motion.  Lymphadenopathy:    He has no cervical adenopathy.  Neurological: He is alert and oriented to person, place, and time. He has normal reflexes.  Skin: Skin is warm and dry. He is not diaphoretic.  Psychiatric: Judgment normal.    BP (!) 160/89   Pulse 67   Temp 98 F (36.7 C) (Oral)   Resp 18   Ht 5' 9.69" (1.77 m)   Wt 190 lb 6.4 oz (86.4 kg)   SpO2 94%   BMI 27.57 kg/m   Assessment and Plan: 1. Annual physical exam - Pt presents for annual exam. Declines colonoscopy - will consider  cologard, information provided. Depression is controlled on Wellbutrin 190m and Prozac 296m Routine labs are pending. Anticipatory guidance provided.   2. Essential hypertension 3. Elevated blood pressure reading - Blood pressure today is 160/89. He is asymptomatic. Increase lisinopril to 3066maily. RTC in 3 months for recheck.  - lisinopril (PRINIVIL,ZESTRIL) 30 MG tablet; Take 1 tablet (30 mg total) by mouth daily.  Dispense: 90 tablet; Refill: 3-  - Recheck vitals  4. Screening for endocrine, metabolic and immunity disorder - Lipid panel - CMP14+EGFR - POCT urinalysis dipstick - Hemoglobin A1c  5. Screening for prostate cancer - PSA  6. Screen for colon cancer - Declines colonoscopy. Will consider cologard, depending on self-pay cost.  - Cologuard  7. Flu vaccine need - Administered by CMA - Flu Vaccine QUAD 36+ mos IM   WhiMercer PodA-C  [facility] [place]/2019 8:14 AM

## 2018-04-11 LAB — LIPID PANEL
Chol/HDL Ratio: 2.6 ratio (ref 0.0–5.0)
Cholesterol, Total: 193 mg/dL (ref 100–199)
HDL: 75 mg/dL (ref >39)
LDL Calculated: 80 mg/dL (ref 0–99)
Triglycerides: 191 mg/dL — ABNORMAL HIGH (ref 0–149)
VLDL Cholesterol Cal: 38 mg/dL (ref 5–40)

## 2018-04-11 LAB — CMP14+EGFR
ALT: 59 IU/L — ABNORMAL HIGH (ref 0–44)
AST: 58 IU/L — ABNORMAL HIGH (ref 0–40)
Albumin/Globulin Ratio: 2 (ref 1.2–2.2)
Albumin: 4.4 g/dL (ref 3.5–5.5)
Alkaline Phosphatase: 81 IU/L (ref 39–117)
BUN/Creatinine Ratio: 15 (ref 9–20)
BUN: 13 mg/dL (ref 6–24)
Bilirubin Total: 0.8 mg/dL (ref 0.0–1.2)
CO2: 22 mmol/L (ref 20–29)
Calcium: 9.2 mg/dL (ref 8.7–10.2)
Chloride: 100 mmol/L (ref 96–106)
Creatinine, Ser: 0.84 mg/dL (ref 0.76–1.27)
GFR calc Af Amer: 116 mL/min/{1.73_m2} (ref >59)
GFR calc non Af Amer: 100 mL/min/{1.73_m2} (ref >59)
Globulin, Total: 2.2 g/dL (ref 1.5–4.5)
Glucose: 65 mg/dL (ref 65–99)
Potassium: 3.8 mmol/L (ref 3.5–5.2)
Sodium: 142 mmol/L (ref 134–144)
Total Protein: 6.6 g/dL (ref 6.0–8.5)

## 2018-04-11 LAB — PSA: Prostate Specific Ag, Serum: 1.3 ng/mL (ref 0.0–4.0)

## 2018-04-11 LAB — HEMOGLOBIN A1C
Est. average glucose Bld gHb Est-mCnc: 85 mg/dL
Hgb A1c MFr Bld: 4.6 % — ABNORMAL LOW (ref 4.8–5.6)

## 2018-07-14 ENCOUNTER — Other Ambulatory Visit: Payer: Self-pay | Admitting: Physician Assistant

## 2018-07-14 DIAGNOSIS — F329 Major depressive disorder, single episode, unspecified: Secondary | ICD-10-CM

## 2018-07-14 DIAGNOSIS — F32A Depression, unspecified: Secondary | ICD-10-CM

## 2018-08-01 ENCOUNTER — Encounter: Payer: Self-pay | Admitting: Physician Assistant

## 2018-08-06 ENCOUNTER — Telehealth: Payer: Self-pay | Admitting: Physician Assistant

## 2018-08-06 NOTE — Telephone Encounter (Signed)
Copied from CRM [id number]. Topic: Quick Communication - See Telephone Encounter >> Aug 06, 2018  1:59 PM Arlyss Gandy, NT wrote: CRM for notification. See Telephone encounter for: 08/06/18. Pt would like orders to be put in for his thyroid levels to be checked. Please call pt once orders are placed.

## 2018-08-08 NOTE — Telephone Encounter (Signed)
Please advise on message below. Can pt come in for lab only visit or does he need an appointment? Pt was last seen 03/2018 (McVey Pt)

## 2018-08-09 NOTE — Telephone Encounter (Signed)
Patient called in wanting to know an update on getting lab work

## 2018-08-09 NOTE — Telephone Encounter (Signed)
Left message on patient's voicemail to find out why he is requesting a thyroid level. I didn't see anything in office visit with a need for testing or thyroid medication. Pt was advised to contact the office to let us know if he is having symptoms.

## 2018-09-24 ENCOUNTER — Other Ambulatory Visit: Payer: Self-pay | Admitting: Physician Assistant

## 2018-09-24 DIAGNOSIS — F32A Depression, unspecified: Secondary | ICD-10-CM

## 2018-09-24 DIAGNOSIS — F329 Major depressive disorder, single episode, unspecified: Secondary | ICD-10-CM

## 2018-09-24 DIAGNOSIS — K219 Gastro-esophageal reflux disease without esophagitis: Secondary | ICD-10-CM

## 2018-10-17 ENCOUNTER — Other Ambulatory Visit: Payer: Self-pay | Admitting: Physician Assistant

## 2018-10-17 DIAGNOSIS — F329 Major depressive disorder, single episode, unspecified: Secondary | ICD-10-CM

## 2018-10-17 DIAGNOSIS — F32A Depression, unspecified: Secondary | ICD-10-CM

## 2018-10-18 ENCOUNTER — Encounter: Payer: Self-pay | Admitting: Emergency Medicine

## 2018-10-18 ENCOUNTER — Other Ambulatory Visit: Payer: Self-pay

## 2018-10-18 ENCOUNTER — Ambulatory Visit
Admission: EM | Admit: 2018-10-18 | Discharge: 2018-10-18 | Disposition: A | Discharge status: Home / Self Care | Payer: Self-pay | Attending: Family Medicine | Admitting: Family Medicine

## 2018-10-18 DIAGNOSIS — M546 Pain in thoracic spine: Secondary | ICD-10-CM

## 2018-10-18 LAB — POCT URINALYSIS DIP (MANUAL ENTRY)
Bilirubin, UA: NEGATIVE
Blood, UA: NEGATIVE
Glucose, UA: NEGATIVE mg/dL
Ketones, POC UA: NEGATIVE mg/dL
Leukocytes, UA: NEGATIVE
Nitrite, UA: NEGATIVE
Protein Ur, POC: NEGATIVE mg/dL
Spec Grav, UA: >=1.03 — AB (ref 1.010–1.025)
Urobilinogen, UA: 1 E.U./dL
pH, UA: 6 (ref 5.0–8.0)

## 2018-10-18 NOTE — ED Triage Notes (Signed)
Pt presents to [facility] for assessment of mid bilateral back pain "right around where my kidneys are" developing 2-3 days after a fall on steps on his right side.  States this happened 2 weeks ago.

## 2018-10-18 NOTE — Discharge Instructions (Signed)
Use anti-inflammatories for pain/swelling. You may take up to 800 mg Ibuprofen every 8 hours with food. You may supplement Ibuprofen with Tylenol [phone] mg every 8 hours.   Please give pain another 1-2 weeks to heal as you do still have some bruising present  Urine was completely normal, continue focusing on drinking plenty of fluids/water  Follow up if worsening or changing, developing weakness, issues with urination, abdominal pain

## 2018-10-18 NOTE — ED Provider Notes (Addendum)
[facility]    CSN: [id number] Arrival date & time: 10/18/18  1025     History   Chief Complaint Chief Complaint  Patient presents with  . Back Pain    HPI Jacob Chen is a 53 y.o. male history of hypertension presenting today for evaluation of back pain.  Patient states that he has had back pain over the past 1 to 2 weeks.  States that symptoms began after he fell on carpeted stairs.  States that he only fell down approximately 2-3 stairs.  Since he has had a throbbing sensation in his bilateral mid back.  Is concerned about his kidneys as it feels different than when he has had previous fractured ribs or other muscular back pain.  Denies any hematuria, dysuria, increased frequency.  He does note that he has been sleeping more than normal and is concerned about infection.  Denies any known measured fevers.  Denies any recent travel, denies any known exposure to COVID-19.  He has had a cough, but this feels like his typical cough related to allergies.  Denies shortness of breath or chest pain.  Denies nausea or vomiting, but has had occasional dry heaves but has attributed this to stress/anxiety in the current COVID-19 environment in unemployment status.  Over the past 1 to 2 weeks he felt that the pain has stayed the same and has not had any improvement, but also has not worsened.  Notes the pain will occasionally wake him at night.  Has taken ibuprofen which does ease the pain.  Denies history of kidney stones.  Does feel pain worsens with certain movements including moving upper extremities.   HPI  Past Medical History:  Diagnosis Date  . Hypertension     Patient Active Problem List   Diagnosis Date Noted  . Essential hypertension 10/04/2016    History reviewed. No pertinent surgical history.     Home Medications    Prior to Admission medications   Medication Sig Start Date End Date Taking? Authorizing Provider  buPROPion (WELLBUTRIN XL) 150 MG 24 hr tablet  TAKE ONE TABLET BY MOUTH ONE TIME DAILY. MAY INCREASE TO TWICE DAILY AFTER 2 WEEKS IF NEEDED 09/25/18  Yes McVey, Madelaine Bhat, PA-C  FLUoxetine (PROZAC) 20 MG capsule TAKE 3 CAPSULES BY MOUTH ONCE A DAY  07/15/18  Yes McVey, Madelaine Bhat, PA-C  lisinopril (PRINIVIL,ZESTRIL) 30 MG tablet Take 1 tablet (30 mg total) by mouth daily. 04/10/18  Yes McVey, Madelaine Bhat, PA-C  omeprazole (PRILOSEC) 20 MG capsule TAKE ONE CAPSULE (20MG  TOTAL) BY MOUTH DAILY; TAKE THIRTY MINUTES BEFORE MEAL 09/25/18  Yes McVey, Madelaine Bhat, PA-C  ALPRAZolam Prudy Feeler) 0.5 MG tablet Take 1 tablet (0.5 mg total) by mouth at bedtime as needed for anxiety. 02/07/18   McVey, Madelaine Bhat, PA-C    Family History Family History  Problem Relation Age of Onset  . Cancer Mother   . Hypertension Brother   . Cancer Brother     Social History Social History   Tobacco Use  . Smoking status: Current Every Day Smoker    Packs/day: 0.50    Years: 30.00    Pack years: 15.00  . Smokeless tobacco: Never Used  Substance Use Topics  . Alcohol use: Yes    Alcohol/week: 8.0 standard drinks    Types: 8 Shots of liquor per week  . Drug use: Not on file     Allergies   Penicillins   Review of Systems Review of Systems  Constitutional:  Negative for fatigue and fever.  Eyes: Negative for redness, itching and visual disturbance.  Respiratory: Negative for shortness of breath.   Cardiovascular: Negative for chest pain and leg swelling.  Gastrointestinal: Negative for nausea and vomiting.  Genitourinary: Negative for dysuria, hematuria and urgency.  Musculoskeletal: Positive for back pain and myalgias. Negative for arthralgias.  Skin: Negative for color change, rash and wound.  Neurological: Negative for dizziness, syncope, weakness, light-headedness and headaches.     Physical Exam Triage Vital Signs ED Triage Vitals  Enc Vitals Group     BP 10/18/18 1036 (!) 165/98     Pulse Rate 10/18/18 1036 88      Resp 10/18/18 1036 18     Temp 10/18/18 1036 98 F (36.7 C)     Temp Source 10/18/18 1036 Oral     SpO2 10/18/18 1036 97 %     Weight --      Height --      Head Circumference --      Peak Flow --      Pain Score 10/18/18 1037 8     Pain Loc --      Pain Edu? --      Excl. in GC? --    No data found.  Updated Vital Signs BP (!) 165/98 (BP Location: Left Arm)   Pulse 88   Temp 98 F (36.7 C) (Oral)   Resp 18   SpO2 97%  Bp rechecked- 145/81 Visual Acuity Right Eye Distance:   Left Eye Distance:   Bilateral Distance:    Right Eye Near:   Left Eye Near:    Bilateral Near:     Physical Exam Vitals signs and nursing note reviewed.  Constitutional:      Appearance: He is well-developed.  HENT:     Head: Normocephalic and atraumatic.  Eyes:     Conjunctiva/sclera: Conjunctivae normal.  Neck:     Musculoskeletal: Neck supple.  Cardiovascular:     Rate and Rhythm: Normal rate and regular rhythm.     Heart sounds: No murmur.  Pulmonary:     Effort: Pulmonary effort is normal. No respiratory distress.     Breath sounds: Normal breath sounds.     Comments: Breathing comfortably at rest, CTABL, no wheezing, rales or other adventitious sounds auscultated Abdominal:     Palpations: Abdomen is soft.     Tenderness: There is no abdominal tenderness.  Musculoskeletal:     Comments: No obvious deformity to back, does have 2 bruises on right side and mid right thoracic region as well as lower thoracic/upper lumbar region, nontender to palpation of cervical, thoracic and lumbar spine midline; mild tenderness to palpation over areas with bruising, does not extend to flank, nontender on left side throughout thoracic and lumbar region  Strength 5/5 and equal bilaterally, patellar reflex 2+ bilaterally  Skin:    General: Skin is warm and dry.  Neurological:     Mental Status: He is alert.      UC Treatments / Results  Labs (all labs ordered are listed, but only abnormal  results are displayed) Labs Reviewed  POCT URINALYSIS DIP (MANUAL ENTRY) - Abnormal; Notable for the following components:      Result Value   Spec Grav, UA >=1.030 (*)    All other components within normal limits    EKG None  Radiology No results found.  Procedures Procedures (including critical care time)  Medications Ordered in UC Medications - No data to display  Initial Impression / Assessment and Plan / UC Course  I have reviewed the triage vital signs and the nursing notes.  Pertinent labs & imaging results that were available during my care of the patient were reviewed by me and considered in my medical decision making (see chart for details).     UA negative for blood, leuks, nitrates, protein.  Only appeared concentrated.  Less likely underlying kidney problem.  Feel musculoskeletal cause less likely given associated bruising and likely superficial tissue injury.  Full active range of motion, no neuro deficits, pain does worsen with movement.  Patient felt strongly that he likely did not have a rib fracture as pain does not worsen with breathing and given the nature of his fall he feels this is unlikely, imaging deferred.  Will recommend continued treatment with anti-inflammatories and monitoring.  Pain does not seem connected to abdomen, tolerating oral intake normally, no persistent nausea or vomiting.  Bowel movements normal.Discussed strict return precautions. Patient verbalized understanding and is agreeable with plan.  Final Clinical Impressions(s) / UC Diagnoses   Final diagnoses:  Acute bilateral thoracic back pain     Discharge Instructions     Use anti-inflammatories for pain/swelling. You may take up to 800 mg Ibuprofen every 8 hours with food. You may supplement Ibuprofen with Tylenol [phone] mg every 8 hours.   Please give pain another 1-2 weeks to heal as you do still have some bruising present  Urine was completely normal, continue focusing on  drinking plenty of fluids/water  Follow up if worsening or changing, developing weakness, issues with urination, abdominal pain    ED Prescriptions    None     Controlled Substance Prescriptions [place] Controlled Substance Registry consulted? Not Applicable   Lew Dawes, PA-C 10/18/18 [address], [place], [place] 10/18/18 1128

## 2018-10-18 NOTE — ED Notes (Signed)
Patient able to ambulate independently  

## 2018-10-24 ENCOUNTER — Telehealth: Payer: Self-pay | Admitting: Physician Assistant

## 2018-10-24 NOTE — Telephone Encounter (Signed)
Pt stopped in asking for courtesy refill on Fluoxetine 20 MG capsules will schedule appt 1st available

## 2018-10-24 NOTE — Telephone Encounter (Signed)
Shanon Rosser left a vm stating "he is her pt, and he is moderately to severely depressed, with his bp being 165-98 and this is fairly high for him " she states she contacted the pharmacy already and was told refill request for pt's prozac was denied.She would like to see how this can be filled. She did not identify where she was calling from but left a call back number [phone]

## 2018-10-25 NOTE — Telephone Encounter (Signed)
Please advise as I'm not sure how to handle this.

## 2018-10-30 ENCOUNTER — Telehealth (INDEPENDENT_AMBULATORY_CARE_PROVIDER_SITE_OTHER): Payer: Self-pay | Admitting: Family Medicine

## 2018-10-30 ENCOUNTER — Other Ambulatory Visit: Payer: Self-pay

## 2018-10-30 DIAGNOSIS — F32A Depression, unspecified: Secondary | ICD-10-CM

## 2018-10-30 DIAGNOSIS — F329 Major depressive disorder, single episode, unspecified: Secondary | ICD-10-CM

## 2018-10-30 DIAGNOSIS — K219 Gastro-esophageal reflux disease without esophagitis: Secondary | ICD-10-CM

## 2018-10-30 DIAGNOSIS — I10 Essential (primary) hypertension: Secondary | ICD-10-CM

## 2018-10-30 MED ORDER — FAMOTIDINE 20 MG PO TABS: 20.0000 mg | ORAL_TABLET | Freq: Two times a day (BID) | ORAL | 2 refills | Status: DC

## 2018-10-30 MED ORDER — BUPROPION HCL ER (XL) 150 MG PO TB24: ORAL_TABLET | ORAL | 1 refills | Status: AC

## 2018-10-30 MED ORDER — LISINOPRIL-HYDROCHLOROTHIAZIDE 20-25 MG PO TABS: 1.0000 | ORAL_TABLET | Freq: Every day | ORAL | 0 refills | Status: DC

## 2018-10-30 MED ORDER — FLUOXETINE HCL 20 MG PO CAPS: ORAL_CAPSULE | ORAL | 1 refills | Status: DC

## 2018-10-30 NOTE — Progress Notes (Signed)
Pt would like to talk about his bp and getting back on his prozac. He has not taken the prozac  In about 4 days. Pharmacy and medication verified. Had to go to urgent care recently for back pain, bp was 168/98. Completed the gad7(2) and depression form (11). He does feel that he is sleeping entirely too much and has poor appetite and no energy

## 2018-10-30 NOTE — Progress Notes (Signed)
Virtual Visit via telephone Note  I connected with patient on 10/30/18 at 1005 by telephone and verified that I am speaking with the correct person using two identifiers. Jacob Chen is currently located at home and patient is currently with her during visit. The provider, Myles Lipps, MD is located in their office at time of visit.  I discussed the limitations, risks, security and privacy concerns of performing an evaluation and management service by telephone and the availability of in person appointments. I also discussed with the patient that there may be a patient responsible charge related to this service. The patient expressed understanding and agreed to proceed.  CC: Pt would like to talk about his bp and getting back on his prozac. He has not taken the prozac  In about 4 days. Pharmacy and medication verified. Had to go to urgent care recently for back pain, bp was 168/98. Completed the gad7(2) and depression form (11). He does feel that he is sleeping entirely too much and has poor appetite and no energy   Telephone visit today for medication refills  HPI Previous PCP: McVey, PA-C Last OV Sept 2019? PMH: HTN, depression, anxiety, GERD, obesity  Last refill was for lisinopril 20mg  Was taking prozac 60mg , ran out about a week ago, had some extra prozac and has been taking 40mg  Also taking wellbutrin XL 150mg   Takes xanax very prn, last rx per pmp in July, does not need refill Still doing counseling Feels that he was doing really on this regime  He has lost about 50lbs in the past year Took a break from walking during the winter, he however has continued to monitor what he eats, he has not gained weight, he has started walking again now that weather is better  He does not monitor his BP at home He has had sleep study done, negative  GERD well controlled Has not tried to wean off omeprazole  Last BP Chen was on lisinopril 30mg  once a day Reports normal TSH done  very recently  Wt Readings from Last 3 Encounters:  04/10/18 190 lb 6.4 oz (86.4 kg)  02/12/18 209 lb 3.2 oz (94.9 kg)  10/03/17 230 lb 6.4 oz (104.5 kg)   BP Readings from Last 3 Encounters:  10/18/18 (!) 165/98  04/10/18 (!) 156/88  02/12/18 134/80   Lab Results  Component Value Date   CREATININE 0.84 04/10/2018   BUN 13 04/10/2018   NA 142 04/10/2018   K 3.8 04/10/2018   CL 100 04/10/2018   CO2 22 04/10/2018    Fall Risk  10/30/2018 04/10/2018 02/12/2018 10/03/2017 05/16/2017  Falls in the past year? 0 No No No No  Number falls in past yr: 0 - - - -  Injury with Fall? 0 - - - -     Depression screen [facility] 2/9 10/30/2018 04/10/2018 02/12/2018  Decreased Interest 0 0 3  Down, Depressed, Hopeless 0 0 3  PHQ - 2 Score 0 0 6  Altered sleeping - - 2  Tired, decreased energy - - 3  Change in appetite - - 3  Feeling bad or failure about yourself  - - 3  Trouble concentrating - - 3  Moving slowly or fidgety/restless - - 0  Suicidal thoughts - - 0  PHQ-9 Score - - 20  Difficult doing work/chores - - Very difficult    Allergies  Allergen Reactions  . Penicillins Other (See Comments)    "when I was a kid"  Prior to Admission medications   Medication Sig Start Date End Date Taking? Authorizing Provider  ALPRAZolam Prudy Feeler(XANAX) 0.5 MG tablet Take 1 tablet (0.5 mg total) by mouth at bedtime as needed for anxiety. 02/07/18   McVey, Madelaine BhatElizabeth Whitney, PA-C  buPROPion (WELLBUTRIN XL) 150 MG 24 hr tablet TAKE ONE TABLET BY MOUTH ONE TIME DAILY. MAY INCREASE TO TWICE DAILY AFTER 2 WEEKS IF NEEDED 09/25/18   McVey, Madelaine BhatElizabeth Whitney, PA-C  FLUoxetine (PROZAC) 20 MG capsule TAKE 3 CAPSULES BY MOUTH ONCE A DAY  07/15/18   McVey, Madelaine BhatElizabeth Whitney, PA-C  lisinopril (PRINIVIL,ZESTRIL) 30 MG tablet Take 1 tablet (30 mg total) by mouth daily. 04/10/18   McVey, Madelaine BhatElizabeth Whitney, PA-C  omeprazole (PRILOSEC) 20 MG capsule TAKE ONE CAPSULE (20MG  TOTAL) BY MOUTH DAILY; TAKE THIRTY MINUTES BEFORE MEAL  09/25/18   McVey, Madelaine BhatElizabeth Whitney, PA-C    Past Medical History:  Diagnosis Date  . Hypertension     No past surgical history on file.  Social History   Tobacco Use  . Smoking status: Current Every Day Smoker    Packs/day: 0.50    Years: 30.00    Pack years: 15.00  . Smokeless tobacco: Never Used  Substance Use Topics  . Alcohol use: Yes    Alcohol/week: 8.0 standard drinks    Types: 8 Shots of liquor per week    Family History  Problem Relation Age of Onset  . Cancer Mother   . Hypertension Brother   . Cancer Brother     ROS Per hpi  Objective  Vitals as reported by the patient: none  There were no vitals filed for this visit.  ASSESSMENT and PLAN  1. Essential hypertension Uncontrolled. Changing lisinopril 30mg  to lisinopril-hctz 20-25mg  once a day. Reviewed r/se/b. - Basic Metabolic Panel; Future  2. Gastroesophageal reflux disease, esophagitis presence not specified Patient with sign weight loss, intentional. Will do trial of weaning off PPI with H2B.   3. Depression, unspecified depression type Controlled. Continue current regime.  - FLUoxetine (PROZAC) 20 MG capsule; TAKE 3 CAPSULES BY MOUTH ONCE A DAY - buPROPion (WELLBUTRIN XL) 150 MG 24 hr tablet; TAKE ONE TABLET BY MOUTH ONE TIME DAILY.  Other orders - lisinopril-hydrochlorothiazide (PRINZIDE,ZESTORETIC) 20-25 MG tablet; Take 1 tablet by mouth daily. - famotidine (PEPCID) 20 MG tablet; Take 1 tablet (20 mg total) by mouth 2 (two) times daily.  FOLLOW-UP: 4 weeks for BP check   The above assessment and management plan was discussed with the patient. The patient verbalized understanding of and has agreed to the management plan. Patient is aware to call the clinic if symptoms persist or worsen. Patient is aware when to return to the clinic for a follow-up visit. Patient educated on when it is appropriate to go to the emergency department.    I provided 20 minutes of non-face-to-face time during  this encounter.  Myles LippsIrma M Santiago, MD Primary [facility] at [facility] [address] [place], [place] [address] Ph.  [phone] Fax [phone]

## 2018-11-03 IMAGING — DX DG CHEST 2V
2 series · 2 of 2 positions shown · non-contrast
Comparison: None.

CLINICAL DATA: Cough

EXAM:
CHEST  2 VIEW

[chest pa]
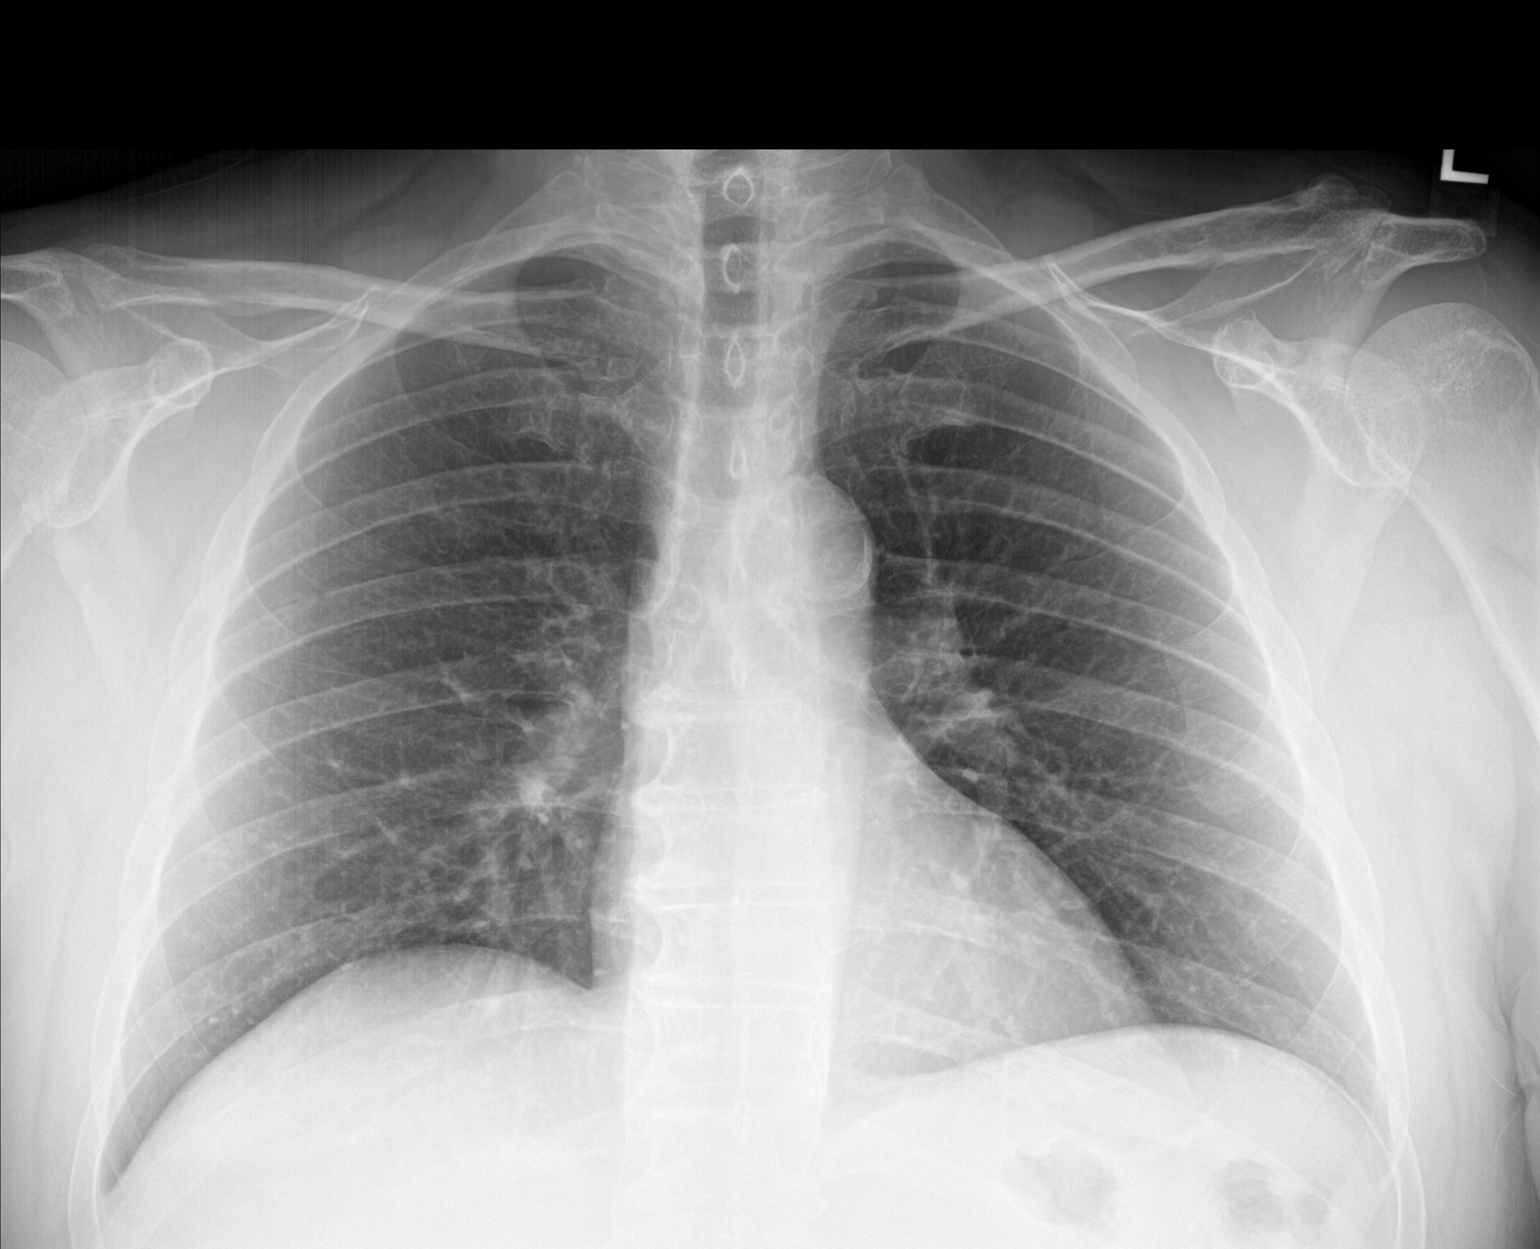

[chest lat]
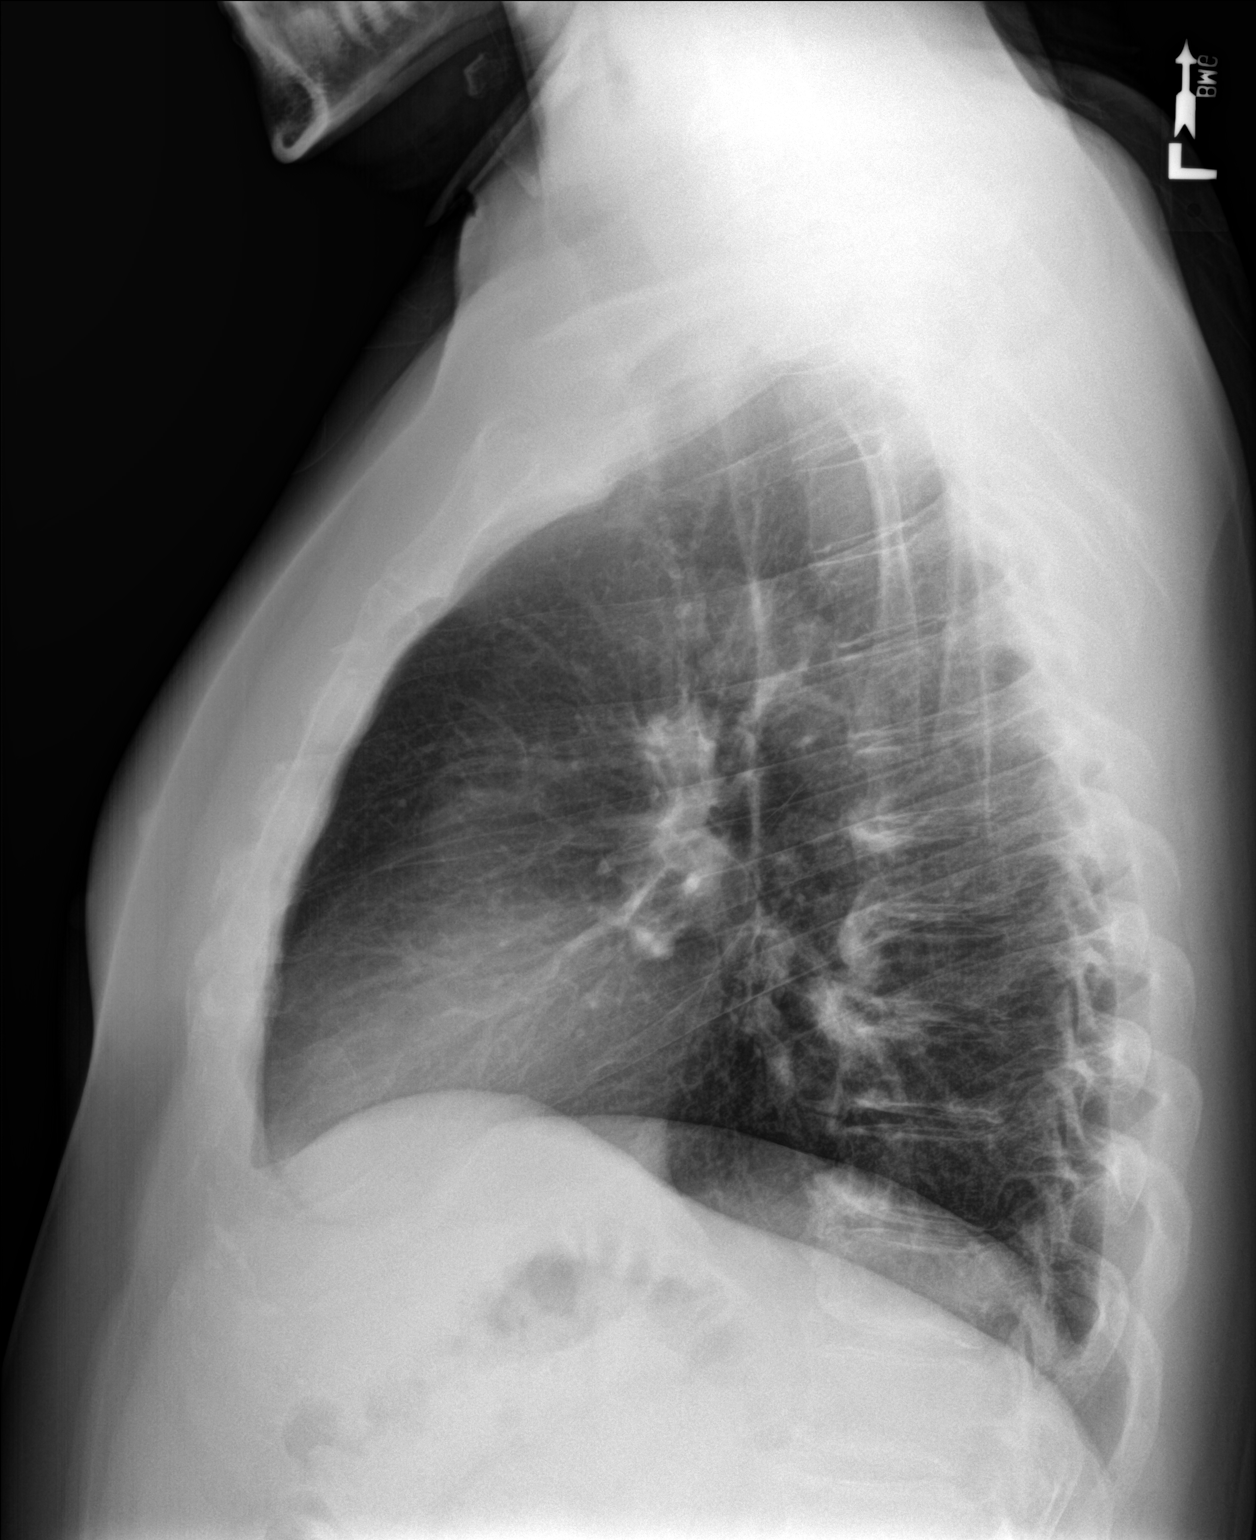

[2 of 2 positions shown; findings below may reference images not displayed]

FINDINGS: Heart and mediastinal contours are within normal limits. No focal
opacities or effusions. No acute bony abnormality.
IMPRESSION: No active cardiopulmonary disease.

## 2018-12-03 ENCOUNTER — Ambulatory Visit (INDEPENDENT_AMBULATORY_CARE_PROVIDER_SITE_OTHER): Payer: Self-pay | Admitting: Family Medicine

## 2018-12-03 ENCOUNTER — Other Ambulatory Visit: Payer: Self-pay

## 2018-12-03 DIAGNOSIS — I10 Essential (primary) hypertension: Secondary | ICD-10-CM

## 2018-12-04 LAB — BASIC METABOLIC PANEL
BUN/Creatinine Ratio: 29 — ABNORMAL HIGH (ref 9–20)
BUN: 26 mg/dL — ABNORMAL HIGH (ref 6–24)
CO2: 20 mmol/L (ref 20–29)
Calcium: 10 mg/dL (ref 8.7–10.2)
Chloride: 100 mmol/L (ref 96–106)
Creatinine, Ser: 0.89 mg/dL (ref 0.76–1.27)
GFR calc Af Amer: 113 mL/min/{1.73_m2} (ref >59)
GFR calc non Af Amer: 98 mL/min/{1.73_m2} (ref >59)
Glucose: 86 mg/dL (ref 65–99)
Potassium: 5.1 mmol/L (ref 3.5–5.2)
Sodium: 134 mmol/L (ref 134–144)

## 2018-12-06 ENCOUNTER — Telehealth (INDEPENDENT_AMBULATORY_CARE_PROVIDER_SITE_OTHER): Payer: Self-pay | Admitting: Family Medicine

## 2018-12-06 ENCOUNTER — Telehealth: Payer: Self-pay | Admitting: Family Medicine

## 2018-12-06 ENCOUNTER — Other Ambulatory Visit: Payer: Self-pay

## 2018-12-06 ENCOUNTER — Encounter: Payer: Self-pay | Admitting: Family Medicine

## 2018-12-06 DIAGNOSIS — R21 Rash and other nonspecific skin eruption: Secondary | ICD-10-CM

## 2018-12-06 DIAGNOSIS — F3341 Major depressive disorder, recurrent, in partial remission: Secondary | ICD-10-CM

## 2018-12-06 DIAGNOSIS — I10 Essential (primary) hypertension: Secondary | ICD-10-CM

## 2018-12-06 NOTE — Telephone Encounter (Signed)
Hello Dr. Leretha Pol,   I believe you asked Jacob Chen to send in a picture of the rash he described to you at his telemed appointment this morning.  See the attachment to his original message.   Thanks,  [employer]

## 2018-12-06 NOTE — Progress Notes (Signed)
Virtual Visit Note  I connected with patient on 12/06/18 at 1027am by phone and verified that I am speaking with the correct person using two identifiers. Jacob Chen is currently located at home and patient is currently with them during visit. The provider, Rutherford Guys, MD is located in their office at time of visit.  I discussed the limitations, risks, security and privacy concerns of performing an evaluation and management service by telephone and the availability of in person appointments. I also discussed with the patient that there may be a patient responsible charge related to this service. The patient expressed understanding and agreed to proceed.   CC: BP and depression  HPI ? PMH: HTN, depression, anxiety, GERD, obesity Last OV April 2020 Started lisinopril-hctz Restarted fluoxetine 32m and wellbutrin XL 1523m Patient reports sign increased urination since starting HCTZ, had once episode of dizziness  He feels that at times he cant empty his bladder, or has to return to urinate about 20 mins after, nocturia 2-3  Has checked BP at work, 118/78  Back on his previous doses of antidepressants Doing really well Cot to go to therapy  Rash on forearm, right, 2- 3 weeks Itchy, scaly, spreading a bit towards elbow Takes asa for past several weeks due to back pain But overall getting better Tried some OTC anti-itch medication that helped  Lab Results  Component Value Date   CREATININE 0.89 12/03/2018   BUN 26 (H) 12/03/2018   NA 134 12/03/2018   K 5.1 12/03/2018   CL 100 12/03/2018   CO2 20 12/03/2018    Allergies  Allergen Reactions  . Penicillins Other (See Comments)    "when I was a kid"     Prior to Admission medications   Medication Sig Start Date End Date Taking? Authorizing Provider  ALPRAZolam (XDuanne Moron0.5 MG tablet Take 1 tablet (0.5 mg total) by mouth at bedtime as needed for anxiety. 02/07/18   McVey, ElGelene MinkPA-C  buPROPion  (WELLBUTRIN XL) 150 MG 24 hr tablet TAKE ONE TABLET BY MOUTH ONE TIME DAILY. 10/30/18   SaRutherford GuysMD  famotidine (PEPCID) 20 MG tablet Take 1 tablet (20 mg total) by mouth 2 (two) times daily. 10/30/18   SaRutherford GuysMD  FLUoxetine (PROZAC) 20 MG capsule TAKE 3 CAPSULES BY MOUTH ONCE A DAY 10/30/18   SaRutherford GuysMD  lisinopril-hydrochlorothiazide (PRINZIDE,ZESTORETIC) 20-25 MG tablet Take 1 tablet by mouth daily. 10/30/18   SaRutherford GuysMD  omeprazole (PRILOSEC) 20 MG capsule TAKE ONE CAPSULE (20MG TOTAL) BY MOUTH DAILY; TAKE THIRTY MINUTES BEFORE MEAL 09/25/18   McVey, ElGelene MinkPA-C    Past Medical History:  Diagnosis Date  . Hypertension     No past surgical history on file.  Social History   Tobacco Use  . Smoking status: Current Every Day Smoker    Packs/day: 0.50    Years: 30.00    Pack years: 15.00  . Smokeless tobacco: Never Used  Substance Use Topics  . Alcohol use: Yes    Alcohol/week: 8.0 standard drinks    Types: 8 Shots of liquor per week    Family History  Problem Relation Age of Onset  . Cancer Mother   . Hypertension Brother   . Cancer Brother     ROS Per hpi  Objective  Vitals as reported by the patient: as above      ASSESSMENT and PLAN  1. Essential hypertension At goal but with  side effects. Discussed changing medication, patient wants to continue with current medications for now, discussed importance of hydration and RTC precautions. - Lipid panel; Future - CMP14+EGFR; Future  2. Depression, unspecified depression type Controlled. Continue current regime.   3. Rash and nonspecific skin eruption Feels it its getting better, OTC seems to have helped. Discussed ASA use. I wonder if ring worm, discussed OTC clotrimazole.   FOLLOW-UP: 4 weeks BP   The above assessment and management plan was discussed with the patient. The patient verbalized understanding of and has agreed to the management plan. Patient is aware  to call the clinic if symptoms persist or worsen. Patient is aware when to return to the clinic for a follow-up visit. Patient educated on when it is appropriate to go to the emergency department.    I provided 20 minutes of non-face-to-face time during this encounter.  Rutherford Guys, MD Primary Care at [place], [place] [address] Ph.  [phone] Fax [phone]

## 2018-12-06 NOTE — Progress Notes (Signed)
Hypertension follow up, he is currently on the omeprazole-htz. Says that medication side effect is causing him to urinate a lot. He also has a concern of a rash that has developed on both arms. He describes it as "looks as old people skin" he is uploading a picture to mychart for view. Says he is no longer dealing with anxiety. He feels he is on the right medication and the therapy he is receiving is going very well. No feelings of depression. He does not need any refill at this time

## 2018-12-10 ENCOUNTER — Telehealth: Payer: Self-pay | Admitting: Family Medicine

## 2018-12-10 NOTE — Telephone Encounter (Signed)
called pt LVM for him to call back and reschedule for 1 month out follow up with [place]  FR

## 2018-12-24 ENCOUNTER — Other Ambulatory Visit: Payer: Self-pay | Admitting: Physician Assistant

## 2018-12-24 DIAGNOSIS — I10 Essential (primary) hypertension: Secondary | ICD-10-CM

## 2018-12-25 ENCOUNTER — Other Ambulatory Visit: Payer: Self-pay | Admitting: Physician Assistant

## 2018-12-25 DIAGNOSIS — I10 Essential (primary) hypertension: Secondary | ICD-10-CM

## 2018-12-30 ENCOUNTER — Telehealth: Payer: Self-pay | Admitting: Physician Assistant

## 2018-12-30 NOTE — Telephone Encounter (Signed)
Copied from [place] [id number]. Topic: Quick Communication - Rx Refill/Question >> Dec 30, 2018  1:39 PM Jacob Chen wrote: Medication: lisinopril-hydrochlorothiazide (PRINZIDE,ZESTORETIC) 20-25 MG tablet [  Patient is requesting refill of this medication. Pt is requesting 90 day supply     Preferred Pharmacy (with phone number or street name): [facility] # [address], [place] - [place] [phone] (Phone) [phone] (Fax)

## 2018-12-31 ENCOUNTER — Other Ambulatory Visit: Payer: Self-pay

## 2018-12-31 DIAGNOSIS — I10 Essential (primary) hypertension: Secondary | ICD-10-CM

## 2018-12-31 MED ORDER — LISINOPRIL-HYDROCHLOROTHIAZIDE 20-25 MG PO TABS: 1.0000 | ORAL_TABLET | Freq: Every day | ORAL | 0 refills | Status: DC

## 2018-12-31 NOTE — Telephone Encounter (Signed)
Rx sent to pharmacy   

## 2019-01-01 ENCOUNTER — Telehealth: Payer: Self-pay | Admitting: Family Medicine

## 2019-01-01 NOTE — Telephone Encounter (Signed)
Pt is under the impression he is suppose to be getting a refill on his  FLUoxetine (PROZAC) 20 MG capsule [[id number]]. He would like this sent to Pharmacy:  [facility] # [place], [place]. He would also like more than 90 capsules, he says that's only one months worth. Please advise at [phone]

## 2019-01-02 ENCOUNTER — Other Ambulatory Visit: Payer: Self-pay

## 2019-01-02 DIAGNOSIS — F32A Depression, unspecified: Secondary | ICD-10-CM

## 2019-01-02 DIAGNOSIS — F329 Major depressive disorder, single episode, unspecified: Secondary | ICD-10-CM

## 2019-01-02 MED ORDER — FLUOXETINE HCL 20 MG PO CAPS: ORAL_CAPSULE | ORAL | 2 refills | Status: AC

## 2019-01-03 NOTE — Telephone Encounter (Signed)
Medication was sent to pharmacy with confirmation yesterday.

## 2019-01-17 ENCOUNTER — Telehealth: Payer: Self-pay | Admitting: Family Medicine

## 2019-01-17 ENCOUNTER — Other Ambulatory Visit: Payer: Self-pay | Admitting: Family Medicine

## 2019-01-17 NOTE — Telephone Encounter (Signed)
Requested Prescriptions  Pending Prescriptions Disp Refills  . famotidine (PEPCID) 20 MG tablet [Pharmacy Med Name: Famotidine Oral Tablet 20 MG] 60 tablet 0    Sig: TAKE ONE TABLET BY MOUTH TWICE DAILY     Gastroenterology:  H2 Antagonists Passed - 01/17/2019 10:42 AM      Passed - Valid encounter within last 12 months    Recent Outpatient Visits          1 month ago Essential hypertension   Primary Care at [facility], Arlie Solomons, MD   9 months ago Annual physical exam   Primary Care at [facility], [place], PA-C   11 months ago Depression, unspecified depression type   Primary Care at [facility], Gelene Mink, PA-C   1 year ago Essential hypertension   Primary Care at [facility], Gelene Mink, PA-C   1 year ago Epigastric pain   Primary Care at [facility], Gelene Mink, [place]

## 2019-01-21 ENCOUNTER — Other Ambulatory Visit: Payer: Self-pay | Admitting: Physician Assistant

## 2019-01-21 DIAGNOSIS — I10 Essential (primary) hypertension: Secondary | ICD-10-CM

## 2019-01-21 NOTE — Telephone Encounter (Signed)
Will Dr. Santiago be taking over this rx 

## 2019-05-02 ENCOUNTER — Other Ambulatory Visit: Payer: Self-pay | Admitting: Family Medicine

## 2019-05-02 DIAGNOSIS — I10 Essential (primary) hypertension: Secondary | ICD-10-CM

## 2019-06-21 ENCOUNTER — Other Ambulatory Visit: Payer: Self-pay | Admitting: Family Medicine

## 2019-07-18 ENCOUNTER — Other Ambulatory Visit: Payer: Self-pay | Admitting: Family Medicine

## 2019-07-20 ENCOUNTER — Other Ambulatory Visit: Payer: Self-pay | Admitting: Family Medicine

## 2019-07-20 DIAGNOSIS — F329 Major depressive disorder, single episode, unspecified: Secondary | ICD-10-CM

## 2019-07-20 DIAGNOSIS — F32A Depression, unspecified: Secondary | ICD-10-CM

## 2019-07-21 NOTE — Telephone Encounter (Signed)
Requested medication (s) are due for refill today: yes  Requested medication (s) are on the active medication list: yes  Last refill:  04/24/2019  Future visit scheduled: no  Notes to clinic:  no valid encounter within last 6 months   Requested Prescriptions  Pending Prescriptions Disp Refills   buPROPion (WELLBUTRIN XL) 150 MG 24 hr tablet [Pharmacy Med Name: buPROPion HCl ER (XL) Oral Tablet Extended Release 24 Hour 150 MG] 90 tablet 0    Sig: TAKE ONE TABLET BY MOUTH ONE TIME DAILY.      Psychiatry: Antidepressants - bupropion Failed - 07/20/2019 12:34 PM      Failed - Last BP in normal range    BP Readings from Last 1 Encounters:  10/18/18 (!) 165/98          Failed - Valid encounter within last 6 months    Recent Outpatient Visits           7 months ago Essential hypertension   Primary Care at [facility], Meda Coffee, MD   7 months ago Essential hypertension   Primary Care at [facility], Manus Rudd, MD   8 months ago Essential hypertension   Primary Care at [facility], Meda Coffee, MD   1 year ago Annual physical exam   Primary Care at [facility], Madelaine Bhat, PA-C   1 year ago Depression, unspecified depression type   Primary Care at [facility], [place], PA-C              Passed - Completed PHQ-2 or PHQ-9 in the last 360 days.

## 2019-07-28 ENCOUNTER — Telehealth (HOSPITAL_COMMUNITY): Payer: Self-pay | Admitting: *Deleted

## 2019-07-28 ENCOUNTER — Ambulatory Visit: Payer: Self-pay

## 2019-07-28 NOTE — Telephone Encounter (Signed)
Called patient in regards to lung cancer screening tonight. No one answered phone left voicemail for patient to call me back.  Patient called me back. Explained to patient that he is not eligible for the lung cancer screening tonight since he is not 54. Told patient to give [place] a call when he is 38 and that we can get him scheduled if we have any upcoming lung cancer screenings. Patient verbalized understanding.

## 2021-11-21 NOTE — Telephone Encounter (Signed)
na
